# Patient Record
Sex: Male | Born: 1965
Health system: Southern US, Community
[De-identification: ages and names within clinical notes are randomized; demographics above are authoritative.]

## PROBLEM LIST (undated history)

## (undated) DIAGNOSIS — T7840XA Allergy, unspecified, initial encounter: Secondary | ICD-10-CM

## (undated) DIAGNOSIS — G4733 Obstructive sleep apnea (adult) (pediatric): Secondary | ICD-10-CM

## (undated) DIAGNOSIS — I10 Essential (primary) hypertension: Secondary | ICD-10-CM

## (undated) DIAGNOSIS — Z5189 Encounter for other specified aftercare: Secondary | ICD-10-CM

## (undated) DIAGNOSIS — R002 Palpitations: Secondary | ICD-10-CM

## (undated) HISTORY — DX: Obstructive sleep apnea (adult) (pediatric): G47.33

## (undated) HISTORY — DX: Allergy, unspecified, initial encounter: T78.40XA

## (undated) HISTORY — DX: Encounter for other specified aftercare: Z51.89

## (undated) HISTORY — DX: Palpitations: R00.2

## (undated) HISTORY — PX: OTHER SURGICAL HISTORY: SHX169

## (undated) HISTORY — DX: Essential (primary) hypertension: I10

---

## 2003-01-28 ENCOUNTER — Encounter: Admission: RE | Admit: 2003-01-28 | Discharge: 2003-01-28 | Payer: Self-pay | Admitting: Family Medicine

## 2003-01-28 ENCOUNTER — Ambulatory Visit (HOSPITAL_COMMUNITY): Admission: RE | Admit: 2003-01-28 | Discharge: 2003-01-28 | Payer: Self-pay | Admitting: Family Medicine

## 2003-02-18 ENCOUNTER — Encounter: Admission: RE | Admit: 2003-02-18 | Discharge: 2003-02-18 | Payer: Self-pay | Admitting: Family Medicine

## 2003-03-11 ENCOUNTER — Encounter: Admission: RE | Admit: 2003-03-11 | Discharge: 2003-03-11 | Payer: Self-pay | Admitting: Sports Medicine

## 2004-12-19 ENCOUNTER — Ambulatory Visit: Payer: Self-pay | Admitting: Family Medicine

## 2005-05-01 ENCOUNTER — Ambulatory Visit: Payer: Self-pay | Admitting: Family Medicine

## 2006-05-14 ENCOUNTER — Ambulatory Visit: Payer: Self-pay | Admitting: Sports Medicine

## 2006-06-04 ENCOUNTER — Ambulatory Visit: Payer: Self-pay | Admitting: Family Medicine

## 2006-10-10 ENCOUNTER — Ambulatory Visit: Payer: Self-pay | Admitting: Family Medicine

## 2007-01-23 DIAGNOSIS — I10 Essential (primary) hypertension: Secondary | ICD-10-CM | POA: Insufficient documentation

## 2007-05-26 ENCOUNTER — Telehealth: Payer: Self-pay | Admitting: *Deleted

## 2007-05-27 ENCOUNTER — Ambulatory Visit: Payer: Self-pay | Admitting: Sports Medicine

## 2007-06-19 ENCOUNTER — Ambulatory Visit: Payer: Self-pay | Admitting: Family Medicine

## 2008-01-22 ENCOUNTER — Ambulatory Visit: Payer: Self-pay | Admitting: Family Medicine

## 2008-03-17 ENCOUNTER — Ambulatory Visit: Payer: Self-pay | Admitting: Family Medicine

## 2008-03-17 LAB — CONVERTED CEMR LAB
ALT: 18 units/L (ref 0–53)
AST: 18 units/L (ref 0–37)
BUN: 13 mg/dL (ref 6–23)
CO2: 24 meq/L (ref 19–32)
Chloride: 104 meq/L (ref 96–112)
Creatinine, Ser: 1.05 mg/dL (ref 0.40–1.50)
LDL Cholesterol: 104 mg/dL — ABNORMAL HIGH (ref 0–99)
Potassium: 4.5 meq/L (ref 3.5–5.3)
Total Protein: 7.8 g/dL (ref 6.0–8.3)
Triglycerides: 103 mg/dL (ref <150)
VLDL: 21 mg/dL (ref 0–40)

## 2008-03-19 ENCOUNTER — Encounter: Payer: Self-pay | Admitting: Family Medicine

## 2008-09-02 ENCOUNTER — Encounter: Payer: Self-pay | Admitting: *Deleted

## 2009-01-27 ENCOUNTER — Ambulatory Visit: Payer: Self-pay | Admitting: Family Medicine

## 2009-01-27 DIAGNOSIS — M6580 Other synovitis and tenosynovitis, unspecified site: Secondary | ICD-10-CM | POA: Insufficient documentation

## 2009-04-11 ENCOUNTER — Encounter: Payer: Self-pay | Admitting: Family Medicine

## 2009-04-11 ENCOUNTER — Ambulatory Visit: Payer: Self-pay | Admitting: Family Medicine

## 2009-04-11 DIAGNOSIS — J45909 Unspecified asthma, uncomplicated: Secondary | ICD-10-CM | POA: Insufficient documentation

## 2009-04-19 ENCOUNTER — Encounter: Admission: RE | Admit: 2009-04-19 | Discharge: 2009-04-19 | Payer: Self-pay | Admitting: Family Medicine

## 2009-04-28 ENCOUNTER — Telehealth: Payer: Self-pay | Admitting: Family Medicine

## 2009-06-08 ENCOUNTER — Telehealth: Payer: Self-pay | Admitting: *Deleted

## 2009-06-13 ENCOUNTER — Encounter: Payer: Self-pay | Admitting: Family Medicine

## 2009-06-13 ENCOUNTER — Ambulatory Visit: Payer: Self-pay | Admitting: Family Medicine

## 2009-06-13 LAB — CONVERTED CEMR LAB
Chloride: 105 meq/L (ref 96–112)
Creatinine, Ser: 1.12 mg/dL (ref 0.40–1.50)
Sodium: 142 meq/L (ref 135–145)

## 2009-12-01 ENCOUNTER — Ambulatory Visit: Payer: Self-pay | Admitting: Family Medicine

## 2009-12-01 DIAGNOSIS — E669 Obesity, unspecified: Secondary | ICD-10-CM | POA: Insufficient documentation

## 2009-12-01 DIAGNOSIS — R351 Nocturia: Secondary | ICD-10-CM | POA: Insufficient documentation

## 2009-12-01 LAB — CONVERTED CEMR LAB
Bilirubin Urine: NEGATIVE
Blood in Urine, dipstick: NEGATIVE
Ketones, urine, test strip: NEGATIVE
Specific Gravity, Urine: 1.025
Urobilinogen, UA: 0.2

## 2009-12-15 ENCOUNTER — Telehealth: Payer: Self-pay | Admitting: Family Medicine

## 2009-12-29 ENCOUNTER — Ambulatory Visit: Payer: Self-pay | Admitting: Family Medicine

## 2009-12-29 LAB — CONVERTED CEMR LAB
Glucose, Bld: 90 mg/dL (ref 70–99)
PSA: 0.29 ng/mL (ref 0.10–4.00)
Potassium: 4.5 meq/L (ref 3.5–5.3)
Sodium: 139 meq/L (ref 135–145)

## 2010-07-26 ENCOUNTER — Encounter: Payer: Self-pay | Admitting: Family Medicine

## 2010-12-26 NOTE — Progress Notes (Signed)
Summary: triage  Phone Note Call from Patient Call back at Home Phone [phone]   Caller: Patient Summary of Call: Pt's bp is up 168/117 this a.m.  and at 11a.m. 178/121  and about 1:50 157/98.  Said that he Dr. Deirdre Priest had talked about going up on his bp med. Initial call taken by: Clydell Hakim,  December 15, 2009 2:19 PM  Follow-up for Phone Call        left message that I will forward to PCP Follow-up by: Gladstone Pih,  December 15, 2009 2:40 PM  Additional Follow-up for Phone Call Additional follow up Details #1::        Ask him to take 2 enalapril every day and continue to monitor his blood pressure see me in 2 weeks.  I will call in a new rx if he needs it thanks [facility] Additional Follow-up by: Pearlean Brownie MD,  December 15, 2009 3:30 PM    Additional Follow-up for Phone Call Additional follow up Details #2::    left message to return call Follow-up by: Gladstone Pih,  December 15, 2009 4:43 PM  Additional Follow-up for Phone Call Additional follow up Details #3:: Details for Additional Follow-up Action Taken: Pt returned call. Additional Follow-up by: Clydell Hakim,  December 15, 2009 4:50 PM  New/Updated Medications: ENALAPRIL MALEATE 10 MG  TABS (ENALAPRIL MALEATE) Take 2 tab by mouth daily left message.Golden Circle RN  December 16, 2009 9:18 AM  he called back. gave him md response .thinks he has enough meds to lat until appt with pcp 2/3.Marland KitchenGolden Circle RN  December 19, 2009 2:03 PM

## 2010-12-26 NOTE — Miscellaneous (Signed)
  Clinical Lists Changes  Problems: Changed problem from ASTHMA, MILD, INTERMITTENT (ICD-493.90) to ASTHMA, PERSISTENT (ICD-493.90) 

## 2010-12-26 NOTE — Assessment & Plan Note (Signed)
Summary: cpe,df   Vital Signs:  Patient profile:   45 year old male Height:      71 inches Weight:      286.1 pounds BMI:     40.05 Temp:     97.5 degrees F oral Pulse rate:   89 / minute BP sitting:   161 / 95  (left arm) Cuff size:   large  Vitals Entered By: Garen Grams LPN (December 01, 2009 9:29 AM) CC: CPE Is Patient Diabetic? No Pain Assessment Patient in pain? no        CC:  CPE.  History of Present Illness: Current Problems:  NOCTURIA (XBJ-478.29) has had 3 nights this week that underwear had small amount of urine on them.  Has happened occaisionally over the years.  Did drink larger amts of liquid prior to one of the nights.  Seen at health dept who did what sounds like a swab that showed only inflamation.  No pain no masses no change in stream or frequency or bleeding  ASTHMA, MILD, INTERMITTENT (ICD-493.90) using albuterol regularly but no flovent.  Not interfering with activities  HYPERTENSION, BENIGN SYSTEMIC (ICD-401.1) Disease Monitoring   Blood pressure range:has machine but not checkign       Chest pain: N     Dyspnea:n Medications   Compliance: just restarted medication one day agao   Lightheadedness: N     Edema:N Prevention   Exercise: none regularly    Salt restriction:No  TENDINITIS, KNEE (ICD-727.09) improved with leg strenthening  ROS - as above PMH - Medications reviewed and updated in medication list.  Smoking Status noted in VS form     Habits & Providers  Alcohol-Tobacco-Diet     Tobacco Status: never  Current Medications (verified): 1)  Flovent Hfa 110 Mcg/act Aero (Fluticasone Propionate  Hfa) .Marland Kitchen.. 1 Puff Two Times A Day 2)  Enalapril Maleate 10 Mg  Tabs (Enalapril Maleate) .... Take 1 Tab By Mouth Daily 3)  Proair Hfa 108 (90 Base) Mcg/act  Aers (Albuterol Sulfate) .... 2 Inh Q4h As Needed Shortness of Breath Do Not Use Regularly  Allergies: 1)  Amoxicillin (Amoxicillin)  Social History: Horticulturist, commercial Working  at [employer]   Physical Exam  General:  Well-developed,well-nourished,in no acute distress; alert,appropriate and cooperative throughout examination obese Lungs:  Normal respiratory effort, chest expands symmetrically. Lungs are clear to auscultation, no crackles or wheezes. Heart:  Normal rate and regular rhythm. S1 and S2 normal without gallop, murmur, click, rub or other extra sounds. Abdomen:  Bowel sounds positive,abdomen soft and non-tender without masses, organomegaly or hernias noted. obese Rectal:  No external abnormalities noted. Normal sphincter tone. No rectal masses or tenderness. Genitalia:  Testes bilaterally descended without nodularity, tenderness or masses. No scrotal masses or lesions. No penis lesions or urethral discharge. Prostate:  Prostate gland firm and smooth, no enlargement, nodularity, tenderness, mass, asymmetry or induration.  Not completely felt due to habitus Extremities:  Quad on left less strong than on right.  No effusion of knees   Impression & Recommendations:  Problem # 1:  NOCTURIA (ICD-788.43) normal exam and urinalysis.  No red flags for infection.  Will check PSA after had discussion with next blood draw Orders: Urinalysis-[facility] (00000) [facility]- Est  Level 4 (56213)  Problem # 2:  HYPERTENSION, BENIGN SYSTEMIC (ICD-401.1) Not well controlled.  Monitor and likely increase his medications  His updated medication list for this problem includes:    Enalapril Maleate 10 Mg  Tabs (Enalapril maleate) .Marland Kitchen... Take 1 tab by mouth daily  Orders: [facility]- Est  Level 4 (99214)Future Orders: Basic Met-[facility] ([id number]) ... 11/29/2010  Problem # 3:  ASTHMA, MILD, INTERMITTENT (ICD-493.90) Assessment: Unchanged  Discussed appropriate way to take controller and rescue meds The following medications were removed from the medication list:    Prednisone 20 Mg Tabs (Prednisone) .Marland Kitchen... 2 tabs once a day for five days His updated medication list for this  problem includes:    Flovent Hfa 110 Mcg/act Aero (Fluticasone propionate  hfa) .Marland Kitchen... 1 puff two times a day    Proair Hfa 108 (90 Base) Mcg/act Aers (Albuterol sulfate) .Marland Kitchen... 2 inh q4h as needed shortness of breath do not use regularly  Orders: [facility]- Est  Level 4 (78295)  Problem # 4:  TENDINITIS, KNEE (ICD-727.09) Assessment: Improved discussed need for weight loss with arthritis history  Complete Medication List: 1)  Flovent Hfa 110 Mcg/act Aero (Fluticasone propionate  hfa) .Marland Kitchen.. 1 puff two times a day 2)  Enalapril Maleate 10 Mg Tabs (Enalapril maleate) .... Take 1 tab by mouth daily 3)  Proair Hfa 108 (90 Base) Mcg/act Aers (Albuterol sulfate) .... 2 inh q4h as needed shortness of breath do not use regularly  Other Orders: Future Orders: PSA-[facility] ([id number]) ... 11/29/2010  Patient Instructions: 1)  Please schedule a follow-up appointment in 2 months.  2)  Call in 2 weeks if your blood pressure is not < 140/90 most of the time.  We will change your medications 3)  If you develop pain when you urinate or if the losing urine gets worse come back immediately 4)  It is important that you exercise reguarly at least 20 minutes 5 times a week. If you develop chest pain, have severe difficulty breathing, or feel very tired, stop exercising immediately and seek medical attention.  5)  You need to lose weight.  Prescriptions: PROAIR HFA 108 (90 BASE) MCG/ACT  AERS (ALBUTEROL SULFATE) 2 inh q4h as needed shortness of breath do not use regularly  #1 x 3   Entered and Authorized by:   Pearlean Brownie MD   Signed by:   Pearlean Brownie MD on 12/01/2009   Method used:   [facility] to        [facility]  [address] [phone][facility] (retail)       [address]       [place], [place]  [id number]       Ph: [phone] or [phone]       Fax: [phone]   RxID:   [id number] FLOVENT HFA 110 MCG/ACT AERO (FLUTICASONE PROPIONATE  HFA) 1 puff two times a day  #1  x 11   Entered and Authorized by:   Pearlean Brownie MD   Signed by:   Pearlean Brownie MD on 12/01/2009   Method used:   [facility] to        [facility]  [address] [phone]* (retail)       [address]       [place], [place]  [id number]       Ph: [phone] or [phone]       Fax: [phone]   RxID:   [id number] ENALAPRIL MALEATE 10 MG  TABS (ENALAPRIL MALEATE) Take 1 tab by mouth daily  #30 x 1   Entered and Authorized by:   Pearlean Brownie MD   Signed by:   Pearlean Brownie  MD on 12/01/2009   Method used:   [facility]        [facility]  [address] [phone]* (retail)       [address]       [place], [place]  [address]       Ph: [phone] or [phone]       Fax: [phone]   RxID:   [id number]   Laboratory Results   Urine Tests  Date/Time Received: December 01, 2009 9:34 AM  Date/Time Reported: December 01, 2009 9:42 AM   Routine Urinalysis   Color: yellow Appearance: Clear Glucose: negative   (Normal Range: Negative) Bilirubin: negative   (Normal Range: Negative) Ketone: negative   (Normal Range: Negative) Spec. Gravity: 1.025   (Normal Range: 1.003-1.035) Blood: negative   (Normal Range: Negative) pH: 5.5   (Normal Range: 5.0-8.0) Protein: negative   (Normal Range: Negative) Urobilinogen: 0.2   (Normal Range: 0-1) Nitrite: negative   (Normal Range: Negative) Leukocyte Esterace: negative   (Normal Range: Negative)    Comments: ...........test performed by...........Marland KitchenTerese Door, CMA       Prevention & Chronic Care Immunizations   Influenza vaccine: Historical  (09/01/2008)   Influenza vaccine due: 09/01/2009    Tetanus booster: Not documented    Pneumococcal vaccine: Not documented  Other Screening   PSA: Not documented   PSA ordered.   Smoking status: never  (12/01/2009)  Lipids   Total Cholesterol: 162  (03/17/2008)   LDL: 104  (03/17/2008)   LDL  Direct: Not documented   HDL: 37  (03/17/2008)   Triglycerides: 103  (03/17/2008)  Hypertension   Last Blood Pressure: 161 / 95  (12/01/2009)   Serum creatinine: 1.12  (06/13/2009)   Serum potassium 4.3  (06/13/2009)    Hypertension flowsheet reviewed?: Yes   Progress toward BP goal: Deteriorated  Self-Management Support :    Patient will work on the following items until the next clinic visit to reach self-care goals:     Medications and monitoring: take my medicines every day, check my blood pressure, weigh myself weekly  (12/01/2009)     Eating: eat more vegetables, eat foods that are low in salt  (12/01/2009)     Other: Try a pill box   Consider daily exercise  (12/01/2009)    Hypertension self-management support: Written self-care plan  (12/01/2009)   Hypertension self-care plan printed.

## 2010-12-26 NOTE — Assessment & Plan Note (Signed)
Summary: HTN/[place]   Vital Signs:  Patient profile:   45 year old male Height:      71 inches Weight:      288 pounds BMI:     40.31 Temp:     97.8 degrees F oral Pulse rate:   95 / minute BP sitting:   140 / 93  (left arm) Cuff size:   large  Vitals Entered By: Garen Grams LPN (December 29, 2009 9:37 AM) CC: f/u HTN Is Patient Diabetic? No Pain Assessment Patient in pain? no        CC:  f/u HTN.  History of Present Illness: HYPERTENSION Disease Monitoring   Blood pressure range: forgot readings      Chest pain: N     Dyspnea:N Medications   Compliance: daily enalapril   Lightheadedness: N     Edema:N Prevention   Exercise: 3 x week    Salt restriction:does not add   ROS - as above PMH - Medications reviewed and updated in medication list.  Smoking Status noted in VS form      Habits & Providers  Alcohol-Tobacco-Diet     Tobacco Status: never  Current Medications (verified): 1)  Flovent Hfa 110 Mcg/act Aero (Fluticasone Propionate  Hfa) .Marland Kitchen.. 1 Puff Two Times A Day 2)  Enalapril Maleate 20 Mg  Tabs (Enalapril Maleate) .... Take 1 Tab By Mouth Daily For Hypertension 3)  Proair Hfa 108 (90 Base) Mcg/act  Aers (Albuterol Sulfate) .... 2 Inh Q4h As Needed Shortness of Breath Do Not Use Regularly  Allergies: 1)  Amoxicillin (Amoxicillin)  Physical Exam  General:  Well-developed,well-nourished,in no acute distress; alert,appropriate and cooperative throughout examination   Impression & Recommendations:  Problem # 1:  HYPERTENSION, BENIGN SYSTEMIC (ICD-401.1) Still not controlled.  encourage weight change and monitor.  If persist would add hctz  His updated medication list for this problem includes:    Enalapril Maleate 20 Mg Tabs (Enalapril maleate) .Marland Kitchen... Take 1 tab by mouth daily for hypertension  Orders: Basic Met-[facility] ([id number]) [facility]- Est Level  3 (09811)  Problem # 2:  Preventive Health Care (ICD-V70.0) discussed psa he would like to be  checked  Complete Medication List: 1)  Flovent Hfa 110 Mcg/act Aero (Fluticasone propionate  hfa) .Marland Kitchen.. 1 puff two times a day 2)  Enalapril Maleate 20 Mg Tabs (Enalapril maleate) .... Take 1 tab by mouth daily for hypertension 3)  Proair Hfa 108 (90 Base) Mcg/act Aers (Albuterol sulfate) .... 2 inh q4h as needed shortness of breath do not use regularly  Other Orders: PSA-[facility] ([id number])  Patient Instructions: 1)  Please schedule a follow-up appointment in 6 months .  2)  Call if your blood pressure is regularly > 140/90 3)  Keep up the exercise that is great 4)  Aim to lose 2-4 lb a week - weigh yourself once a week 5)  Portion size control will help  6)  Snack on low calorie foods or cold water Prescriptions: ENALAPRIL MALEATE 20 MG  TABS (ENALAPRIL MALEATE) Take 1 tab by mouth daily for hypertension  #90 x 3   Entered and Authorized by:   Pearlean Brownie MD   Signed by:   Pearlean Brownie MD on 12/29/2009   Method used:   [facility] to        [facility]  [address] [phone]* (retail)       [address]       [place], [place]  [id number]       Ph: [phone] or [phone]       Fax: [phone]   RxID:   [id number]    Prevention & Chronic Care Immunizations   Influenza vaccine: Historical  (09/01/2008)   Influenza vaccine due: 09/01/2009    Tetanus booster: Not documented    Pneumococcal vaccine: Not documented  Other Screening   PSA: Not documented   PSA ordered.   Smoking status: never  (12/29/2009)  Lipids   Total Cholesterol: 162  (03/17/2008)   LDL: 104  (03/17/2008)   LDL Direct: Not documented   HDL: 37  (03/17/2008)   Triglycerides: 103  (03/17/2008)  Hypertension   Last Blood Pressure: 140 / 93  (12/29/2009)   Serum creatinine: 1.12  (06/13/2009)   Serum potassium 4.3  (06/13/2009)    Hypertension flowsheet reviewed?: Yes   Progress toward BP goal: Improved  Self-Management Support :   Personal Goals  (by the next clinic visit) :      Personal blood pressure goal: 140/90  (12/29/2009)   Patient will work on the following items until the next clinic visit to reach self-care goals:     Medications and monitoring: check my blood pressure  (12/29/2009)     Eating: drink diet soda or water instead of juice or soda, use fresh or frozen vegetables  (12/29/2009)     Other: Try a pill box   Consider daily exercise  (12/01/2009)    Hypertension self-management support: Written self-care plan  (12/29/2009)   Hypertension self-care plan printed.

## 2010-12-28 ENCOUNTER — Encounter: Payer: Self-pay | Admitting: *Deleted

## 2011-01-10 ENCOUNTER — Other Ambulatory Visit: Payer: Self-pay | Admitting: Family Medicine

## 2011-01-10 NOTE — Telephone Encounter (Signed)
Please review and refill

## 2011-06-27 ENCOUNTER — Encounter: Payer: Self-pay | Admitting: Family Medicine

## 2011-06-27 ENCOUNTER — Ambulatory Visit (INDEPENDENT_AMBULATORY_CARE_PROVIDER_SITE_OTHER): Payer: BC Managed Care – PPO | Admitting: Family Medicine

## 2011-06-27 DIAGNOSIS — I10 Essential (primary) hypertension: Secondary | ICD-10-CM

## 2011-06-27 DIAGNOSIS — J45909 Unspecified asthma, uncomplicated: Secondary | ICD-10-CM

## 2011-06-27 DIAGNOSIS — Z125 Encounter for screening for malignant neoplasm of prostate: Secondary | ICD-10-CM

## 2011-06-27 DIAGNOSIS — Z23 Encounter for immunization: Secondary | ICD-10-CM

## 2011-06-27 LAB — COMPREHENSIVE METABOLIC PANEL
ALT: 18 U/L (ref 0–53)
AST: 19 U/L (ref 0–37)
Albumin: 4.3 g/dL (ref 3.5–5.2)
Alkaline Phosphatase: 76 U/L (ref 39–117)
BUN: 12 mg/dL (ref 6–23)
CO2: 27 mEq/L (ref 19–32)
Calcium: 9.8 mg/dL (ref 8.4–10.5)
Chloride: 104 mEq/L (ref 96–112)
Creat: 1.05 mg/dL (ref 0.50–1.35)
Glucose, Bld: 88 mg/dL (ref 70–99)
Potassium: 4.4 mEq/L (ref 3.5–5.3)
Sodium: 142 mEq/L (ref 135–145)
Total Bilirubin: 0.7 mg/dL (ref 0.3–1.2)
Total Protein: 7.8 g/dL (ref 6.0–8.3)

## 2011-06-27 LAB — PSA: PSA: 0.27 ng/mL (ref <=4.00)

## 2011-06-27 MED ORDER — ENALAPRIL MALEATE 20 MG PO TABS: 20.0000 mg | ORAL_TABLET | Freq: Every day | ORAL | Status: DC

## 2011-06-27 MED ORDER — TRIAMCINOLONE ACETONIDE 0.1 % EX OINT: TOPICAL_OINTMENT | Freq: Two times a day (BID) | CUTANEOUS | Status: DC

## 2011-06-27 MED ORDER — FLUTICASONE PROPIONATE HFA 110 MCG/ACT IN AERO: 1.0000 | INHALATION_SPRAY | Freq: Two times a day (BID) | RESPIRATORY_TRACT | Status: DC

## 2011-06-27 MED ORDER — ALBUTEROL SULFATE HFA 108 (90 BASE) MCG/ACT IN AERS
2.0000 | INHALATION_SPRAY | RESPIRATORY_TRACT | Status: DC | PRN
Start: 2011-06-27 — End: 2013-10-19

## 2011-06-27 NOTE — Assessment & Plan Note (Signed)
Not well controlled.  Not using his preventer regularly.  Emphasized need to prevent not just treat wheezing episodes.  No signs of CHF

## 2011-06-27 NOTE — Progress Notes (Signed)
  Subjective:    Patient ID: Jeffery Nielsen, male    DOB: Jul 01, 1966, 45 y.o.   MRN: [id number]  HPI  Feels well.  No complaints  HYPERTENSION Disease Monitoring Home BP Monitoring not doing Chest pain- no     Dyspnea-  no  Medications Compliance: takes enalapril intermittently, did take the last 2 days. Lightheadedness-  no  Edema-  no   Asthma Wheezes intermittently and helps when uses albuterol but is out of this and flovent.  No lower extremitiy edema.  Has had since was a child.  No smoking  ROS - See HPI  PMH Cardiovascular risk factors: hypertension Lab Review   Potassium  Date Value Range Status  12/29/2009 4.5  3.5-5.3 (meq/L) Final     Sodium  Date Value Range Status  12/29/2009 139  135-145 (meq/L) Final    Review of Systems Patient reports no  vision/ hearing changes,anorexia, weight change, fever ,adenopathy, persistant / recurrent hoarseness, swallowing issues, chest pain, edema,persistant / recurrent cough, hemoptysis,gastrointestinal  bleeding (melena, rectal bleeding), abdominal pain, excessive heart burn, GU symptoms(dysuria, hematuria, pyuria, voiding/incontinence  Issues) syncope, focal weakness, severe memory loss, concerning skin lesions, depression, anxiety, abnormal bruising/bleeding, major joint swelling.       Objective:   Physical Exam  Heart - Regular rate and rhythm.  No murmurs, gallops or rubs.    Lungs:  Normal respiratory effort, chest expands symmetrically. Lungs are clear to auscultation, no crackles or wheezes. Abdomen: Obese soft and non-tender without masses, organomegaly or hernias noted.  No guarding or rebound Extremities:  No cyanosis, edema, or deformity noted with good range of motion of all major joints.   Neck:  No deformities, thyromegaly, masses, or tenderness noted.   Supple with full range of motion without pain.       Assessment & Plan:

## 2011-06-27 NOTE — Patient Instructions (Signed)
Check your blood pressure is usually > 140/90 then call   Your most important health issue is your weight  Set up a meeting with our health coach and me in 6 weeks    Bring in all your meds  and bring in your blood pressure readings   I will call you if your tests are not good.  Otherwise I will send you a letter.  If you do not hear from me with in 2 weeks please call our office.

## 2011-06-27 NOTE — Assessment & Plan Note (Signed)
Lab Results  Component Value Date   NA 139 12/29/2009   K 4.5 12/29/2009   CL 105 12/29/2009   CO2 22 12/29/2009   BUN 12 12/29/2009   CREATININE 0.91 12/29/2009    BP Readings from Last 3 Encounters:  06/27/11 148/90  12/29/09 140/93  12/01/09 161/95    Assessment: Hypertension control:  moderately elevated  Progress toward goals:  unchanged Barriers to meeting goals:  nonadherence to medications  Plan: Hypertension treatment:  continue current medications.   If BP remains high despite taking enalapril regularly and weight loss will need to add diuretic

## 2011-06-28 ENCOUNTER — Encounter: Payer: Self-pay | Admitting: Family Medicine

## 2011-07-24 NOTE — Progress Notes (Signed)
Schedule co visit for 9/19 while you are in clinic

## 2011-08-15 ENCOUNTER — Encounter: Payer: Self-pay | Admitting: Home Health Services

## 2011-08-15 ENCOUNTER — Ambulatory Visit (INDEPENDENT_AMBULATORY_CARE_PROVIDER_SITE_OTHER): Payer: BC Managed Care – PPO | Admitting: Family Medicine

## 2011-08-15 VITALS — BP 150/102 | HR 85 | Temp 98.1°F | Ht 71.0 in | Wt 295.1 lb

## 2011-08-15 DIAGNOSIS — I10 Essential (primary) hypertension: Secondary | ICD-10-CM

## 2011-08-15 DIAGNOSIS — E669 Obesity, unspecified: Secondary | ICD-10-CM

## 2011-08-15 DIAGNOSIS — Z23 Encounter for immunization: Secondary | ICD-10-CM

## 2011-08-15 NOTE — Patient Instructions (Addendum)
1. Continue to cardio/stength exercises 3-4 times a week. 2. Continue to take enalapril EVERY DAY! 3. Take blood pressure 2-3 times a week and keep record. 4. Call Rosalita Chessman on Tuesday 9/25 for follow up. [phone]. 5. Take Flovent 2 times a day to prevent asthma attacks.  6. Drink 8 glasses of water a day.

## 2011-08-15 NOTE — Assessment & Plan Note (Signed)
Assessment: Condition worsening.   Plan: Continue counseling with health educator for weight loss/exersice.  Pt set goal to exercise 3-4 times a week for and hour.  Doing both cardio/strengthening exercises. Start to drink just water versus soda/drink mixes.

## 2011-08-15 NOTE — Progress Notes (Signed)
  Subjective:    Patient ID: Jeffery Nielsen, male    DOB: 02/09/1966, 45 y.o.   MRN: [id number]  HPI  HYPERTENSION Disease Monitoring  Blood pressure range-Pt could not recall bp values from home.  Chest pain- no  Dyspnea- no Medications Compliance- Pt reports taking medicine most days but misses a few. Took this AM but not yesterday Lightheadedness- no Edema- no    OBESITY  Current weight/BMI :  41.16 How long have been obese:  10 + years Course:  worsening Problems or symptoms it causes:  hypertension   Things have tried to improve:  Increase exercise     Patient Identified Concern: weight, blood pressure Stage of Change Patient Is In: Preparation.  Pt has been making behavioral changes for less than 2 months.  Patient Reported Barriers: lazy, time for just exercising. Patient Reported Perceived Benefits: Increased energy, stamina, control blood pressure, possibility of reducing medications. Patient Reports Self-Efficacy:  Pt displays moderate self efficacy for increasing exercise. Behavior Change Supports:  Goals:  To reduce weight by increasing exercising to 3-4 times a week.  To take bp medications daily and record values 2-3 times a week.  Weekly follow up phone calls with health coach.  Patient Education: We discussed importance of both cardio and strengthening exercises.  Importance of exercise, stress reduction, and medication compliance to control blood pressure.     Review of Systems  See above     Objective:   Physical Exam  No acute distress.      Assessment & Plan:

## 2011-08-15 NOTE — Assessment & Plan Note (Signed)
Assessment: Hypotension control: elevated Progress towards goals: unchaged Barriers to meet goals: inconsistent adherence to medications. Weight gain.   Plan: Hypertension treatment:  Continue current medications and take blood pressure 2-3 times a week.  Weekly phone calls with health coach to report values and progress.  If blood pressure remains high after 3 weeks, possibly increasing enalapril to 2x a week.   Continue counseling with health educator for medication compliance and weight loss.

## 2011-08-24 ENCOUNTER — Telehealth: Payer: Self-pay | Admitting: Home Health Services

## 2011-08-24 NOTE — Telephone Encounter (Signed)
Spoke with [employer].  Pt seems very positive.  Pt reports walking 3 times a week, 2 miles at a time.  He also reports that he will be walking a 5k this Saturday.  Pt reports taking bp medications and flovent daily and has not missed day.  Pt has been taking bp measurements daily and values average around 150/92.  Goals for next week include daily medication compliance, continue walking 3x week, and start to watch his portion size to gain an awareness of how much/little he is eating.  Pt will call back next Thursday 10/4 afternoon.  Pt's overall goal is to control bp and lose weight.

## 2011-09-13 ENCOUNTER — Telehealth: Payer: Self-pay | Admitting: Home Health Services

## 2011-09-13 NOTE — Telephone Encounter (Signed)
Spoke with [employer].  Pt reports taking HTN medication daily, not missing any days.  His average daily bp value is 157/98.  Pt expressed concern over this number.  Pt has been walking/running 5k's almost every weekend and recently reduced 7 minutes off his time to 40 minutes.  Pt also takes Flovent daily and has not had any problems with asthma.  Pt reports having lost 12 lbs over past few weeks.    Pt is highly motivated and shared that he is ready to be healthier.  Pt's overall goal is to lose weight, control HTN and asthma.

## 2011-09-14 MED ORDER — ENALAPRIL MALEATE 20 MG PO TABS: 20.0000 mg | ORAL_TABLET | Freq: Two times a day (BID) | ORAL | Status: DC

## 2011-09-14 NOTE — Telephone Encounter (Signed)
Addended by: Skyy Mcknight, LEE L on: 09/14/2011 10:51 AM   Modules accepted: Orders

## 2011-09-14 NOTE — Telephone Encounter (Signed)
Called him. Decided to increase enalapril to 20 mg twice daily and to see him for an office visit in 2 weeks for blood test

## 2011-09-21 ENCOUNTER — Telehealth: Payer: Self-pay | Admitting: Home Health Services

## 2011-09-21 NOTE — Telephone Encounter (Signed)
Spoke with [employer].  Pt reports taking medications daily and has increased his enalapril to twice a day.  Pt has not been monitoring bp and is looking for a place to start taking it.  I encouraged him to start taking it a few times a week with the medication change for monitoring.  We discussed low sodium diet and to try to stay below 1500 mg.  Pt said he would start to read food labels and become more aware of how much sodium he is in taking.  Pt has been working out at [employer] [place] but that has ended and he has made a plan to find another resource for weekly exercise.   Pt's overall goal is to lose weight and manage bp.

## 2011-11-09 ENCOUNTER — Telehealth: Payer: Self-pay | Admitting: Home Health Services

## 2011-11-09 NOTE — Telephone Encounter (Signed)
Spoke with [employer].  Pt reports taking medications as prescribed.  Enalapril 2x a day.  Does not miss days.  Pt has not been taking blood pressure regularly but had it taken 1x last week and thinks he might need to go up on bp medications.  Scheduled office visit with PCP for 12/03/2011.  Pt reports not working out as much recently but did a 5k on 10/31/11 (time 45 min).  Pt is going to be doing another 5k next week and set goal to start working out on Sunday again.  Pt reported losing 15 lbs over past few months but recently has gained 6 lbs back.   Pt wants to continue with weight loss/exercise.  Pt's overall goal is weight loss/bp management.

## 2011-11-16 ENCOUNTER — Telehealth: Payer: Self-pay | Admitting: Home Health Services

## 2011-11-16 NOTE — Telephone Encounter (Signed)
Spoke with [employer].  Pt reports taking medications as prescribed 2x a day without missing a day.  Pt took bp on Monday 11/12/11; 172/102.   Reminded pt of follow up appointment with PCP on 12/03/11.  Pt participated in 5k last weekend but walking has been inconsistent since.  Pt's strategies for holiday season is to maintain weight.  Filling up on vegetables before less healthy choices.   Pt's overall goal is weight loss/bp management.

## 2011-11-29 ENCOUNTER — Other Ambulatory Visit: Payer: Self-pay | Admitting: Family Medicine

## 2011-12-03 ENCOUNTER — Ambulatory Visit (INDEPENDENT_AMBULATORY_CARE_PROVIDER_SITE_OTHER): Payer: BC Managed Care – PPO | Admitting: Family Medicine

## 2011-12-03 ENCOUNTER — Encounter: Payer: Self-pay | Admitting: Family Medicine

## 2011-12-03 VITALS — BP 150/98 | HR 98 | Temp 97.7°F | Ht 71.0 in | Wt 284.0 lb

## 2011-12-03 DIAGNOSIS — I10 Essential (primary) hypertension: Secondary | ICD-10-CM

## 2011-12-03 DIAGNOSIS — E669 Obesity, unspecified: Secondary | ICD-10-CM

## 2011-12-03 DIAGNOSIS — J45909 Unspecified asthma, uncomplicated: Secondary | ICD-10-CM

## 2011-12-03 DIAGNOSIS — Z202 Contact with and (suspected) exposure to infections with a predominantly sexual mode of transmission: Secondary | ICD-10-CM

## 2011-12-03 MED ORDER — HYDROCHLOROTHIAZIDE 12.5 MG PO CAPS: 12.5000 mg | ORAL_CAPSULE | Freq: Every day | ORAL | Status: DC

## 2011-12-03 NOTE — Assessment & Plan Note (Signed)
Not at goal.   Will add low dose hctz and strongly encourage exercise and weight loss.  His goal is to get off medications Will check bmet after on hctz for two weeks

## 2011-12-03 NOTE — Progress Notes (Signed)
  Subjective:    Patient ID: Jeffery Nielsen, male    DOB: Jan 31, 1966, 46 y.o.   MRN: [id number]  HPI Feels well.  No complaints  HYPERTENSION Disease Monitoring Home BP Monitoring 150s/90s Chest pain- no     Dyspnea-  no  Medications Compliance: taking enalapril regularly twice daily  Lightheadedness-  no  Edema-  no   Asthma  Has not used albuterol for months.  Seems to have gotten better since he has been running 5ks. No nocturnal or exercise symptoms.  No recent exacerbations with colds   OBESITY Current weight/BMI : Body mass index is 39.61 kg/(m^2).    How long have been obese:  10+ years Course:  Somewhat improving Problems or symptoms it causes:  HTn   Things have tried to improve:  Exercise  Patient Identified Concern:  Weight loss, controlling HTN Stage of Change Patient Is In:  preparation making changes for less than 6 months.  Patient Reported Barriers:  Lazy, not natural habits of exercise/diet Patient Reported Perceived Benefits:  Look good, better health Patient Reports Self-Efficacy:   Pt displays some self-efficacy for exercising. Behavior Change Supports:  Exercises with co-workers Goals:  To start training for 5k, start low sodium diet Patient Education:  We discussed importance of monitoring salt in diet, eating 3 meals a day and creating habit of weekly exercising. Pt reports not drinking soda's as much and focuses on drinking just water.      Review of Systems     Objective:   Physical Exam  Lungs:  Normal respiratory effort, chest expands symmetrically. Lungs are clear to auscultation, no crackles or wheezes. Heart - Regular rate and rhythm.  No murmurs, gallops or rubs.    Extremities:  No cyanosis, edema, or deformity noted with good range of motion of all major joints.         Assessment & Plan:

## 2011-12-03 NOTE — Assessment & Plan Note (Signed)
Well controlled with rare albuterol use despite recent URIs.  He feels his running is making it better.

## 2011-12-03 NOTE — Progress Notes (Deleted)
  Subjective:    Patient ID: Jeffery Nielsen, male    DOB: 1966-02-14, 46 y.o.   MRN: [id number]  HPI HYPERTENSION Disease Monitoring Home BP Monitoring {home bp readings:17448} Chest pain- {YES/NO/WILD CARDS:18581}     Dyspnea-  {YES/NO/WILD WUJWJ:19147}  Medications Compliance: {med compliance:10573}. Lightheadedness-  {YES/NO/WILD WGNFA:21308}  Edema-  {YES/NO/WILD CARDS:18581}   ROS - See HPI  PMH Lab Review   Potassium  Date Value Range Status  06/27/2011 4.4  3.5-5.3 (mEq/L) Final     Sodium  Date Value Range Status  06/27/2011 142  135-145 (mEq/L) Final        OBESITY Current weight/BMI : Body mass index is 39.61 kg/(m^2).    How long have been obese:  10+ years Course:  Somewhat improving Problems or symptoms it causes:  HTn   Things have tried to improve:  Exercise  Patient Identified Concern:  Weight loss, controlling HTN Stage of Change Patient Is In:  preparation making changes for less than 6 months.  Patient Reported Barriers:  Lazy, not natural habits of exercise/diet Patient Reported Perceived Benefits:  Look good, better health Patient Reports Self-Efficacy:   Pt displays some self-efficacy for exercising. Behavior Change Supports:  Exercises with co-workers Goals:  To start training for 5k, start low sodium diet Patient Education:  We discussed importance of monitoring salt in diet, eating 3 meals a day and creating habit of weekly exercising. Pt reports not drinking soda's as much and focuses on drinking just water.      Review of Systems     Objective:   Physical Exam        Assessment & Plan:

## 2011-12-03 NOTE — Assessment & Plan Note (Signed)
Assessment:  Some what improved 11 lb wt loss since last visit.  Plan:  Continued counseling with health educator.  Start training for 5k's again.  Goal is a 6k run in February. Start monitoring salt in diet. Start eating 3 meals a day including breakfast.

## 2011-12-03 NOTE — Patient Instructions (Addendum)
Continue enalapril 20 mg twice daily Take HCTZ 12.5 mg each AM  Monitor your blood pressure aim is to be below < 140/90  If you have any chest pain or shortness of breath with exercise let me know  Come in for a blood test in 2 weeks - call the day before  Come back and see Rosalita Chessman in 1 month  1. Start 5k training again. Decide on daily route. 2. Start eating something for breakfast daily.

## 2011-12-17 ENCOUNTER — Other Ambulatory Visit: Payer: BC Managed Care – PPO

## 2011-12-17 DIAGNOSIS — I10 Essential (primary) hypertension: Secondary | ICD-10-CM

## 2011-12-17 NOTE — Progress Notes (Signed)
Bmp done todAy Jeffery Nielsen

## 2011-12-18 ENCOUNTER — Encounter: Payer: Self-pay | Admitting: Family Medicine

## 2011-12-18 LAB — BASIC METABOLIC PANEL
BUN: 17 mg/dL (ref 6–23)
Creat: 1.06 mg/dL (ref 0.50–1.35)

## 2012-01-07 ENCOUNTER — Ambulatory Visit: Payer: BC Managed Care – PPO | Admitting: Family Medicine

## 2012-08-15 ENCOUNTER — Other Ambulatory Visit: Payer: Self-pay | Admitting: Family Medicine

## 2012-08-25 ENCOUNTER — Encounter: Payer: Self-pay | Admitting: Family Medicine

## 2012-08-25 ENCOUNTER — Ambulatory Visit (INDEPENDENT_AMBULATORY_CARE_PROVIDER_SITE_OTHER): Payer: BC Managed Care – PPO | Admitting: Family Medicine

## 2012-08-25 VITALS — BP 153/88 | HR 79 | Temp 98.3°F | Ht 71.0 in | Wt 287.0 lb

## 2012-08-25 DIAGNOSIS — I10 Essential (primary) hypertension: Secondary | ICD-10-CM

## 2012-08-25 DIAGNOSIS — Z23 Encounter for immunization: Secondary | ICD-10-CM

## 2012-08-25 MED ORDER — HYDROCHLOROTHIAZIDE 12.5 MG PO CAPS: 12.5000 mg | ORAL_CAPSULE | Freq: Every day | ORAL | Status: DC

## 2012-08-25 MED ORDER — ENALAPRIL MALEATE 20 MG PO TABS: 20.0000 mg | ORAL_TABLET | Freq: Two times a day (BID) | ORAL | Status: DC

## 2012-08-25 NOTE — Assessment & Plan Note (Signed)
Not optimum.  Will have him monitor and bring in readings.  Would increase HCTZ if remains high.  Check labs

## 2012-08-25 NOTE — Patient Instructions (Addendum)
Check your blood pressure several times a week and write down.  If regularly > 140/90 call us.     I will call you if your tests are not good.  Otherwise I will send you a letter.  If you do not hear from me with in 2 weeks please call our office.     Bring in your   blood pressure readings   our medications   Copy of your work blood tests  Come back in 3 months

## 2012-08-25 NOTE — Progress Notes (Signed)
  Subjective:    Patient ID: [name], male    DOB: 08-Nov-1966, 46 y.o.   MRN: [id number]  HPI  Feels well.  Has mild wheeze occasionally Use proair 1-2 times per month. Able to run 5ks without problems runs several a moth Patient Information Form: Screening and ROS  AUDIT-C Score: 0 Do you feel safe in relationships? yes PHQ-2:negative  Review of Symptoms  General:  Negative for nexplained weight loss, fever Skin: Negative for new or changing mole, sore that won't heal HEENT: Negative for trouble hearing, trouble seeing, ringing in ears, mouth sores, hoarseness, change in voice, dysphagia. CV:  Negative for chest pain, dyspnea, edema, palpitations Resp: Negative for cough, , hemoptysis GI: Negative for nausea, vomiting, diarrhea, constipation, abdominal pain, melena, hematochezia. GU: Negative for dysuria, incontinence, urinary hesitance, hematuria, vaginal or penile discharge, polyuria, sexual difficulty, lumps in testicle or breasts MSK: Negative for muscle cramps or aches, joint pain or swelling Neuro: Negative for headaches, weakness, numbness, dizziness, passing out/fainting Psych: Negative for depression, anxiety, memory problems     Review of Systems     Objective:   Physical Exam Neck:  No deformities, thyromegaly, masses, or tenderness noted.   Supple with full range of motion without pain. Heart - Regular rate and rhythm.  No murmurs, gallops or rubs.    Lungs:  Normal respiratory effort, chest expands symmetrically. Lungs are clear to auscultation, no crackles or wheezes. Abdomen: Obese soft and non-tender without masses, organomegaly or hernias noted.  No guarding or rebound Extremities:  No cyanosis, edema, or deformity noted with good range of motion of all major joints.   Skin:  Intact without suspicious lesions or rashes       Assessment & Plan:   Normal Exam

## 2012-08-26 LAB — COMPREHENSIVE METABOLIC PANEL
AST: 19 U/L (ref 0–37)
Albumin: 4.3 g/dL (ref 3.5–5.2)
Alkaline Phosphatase: 67 U/L (ref 39–117)
BUN: 20 mg/dL (ref 6–23)
Potassium: 4.3 mEq/L (ref 3.5–5.3)
Sodium: 139 mEq/L (ref 135–145)
Total Protein: 7.2 g/dL (ref 6.0–8.3)

## 2012-08-27 ENCOUNTER — Encounter: Payer: Self-pay | Admitting: Family Medicine

## 2012-10-02 ENCOUNTER — Other Ambulatory Visit: Payer: Self-pay | Admitting: Family Medicine

## 2013-01-30 ENCOUNTER — Other Ambulatory Visit: Payer: Self-pay | Admitting: Family Medicine

## 2013-02-27 ENCOUNTER — Other Ambulatory Visit: Payer: Self-pay | Admitting: Family Medicine

## 2013-04-28 ENCOUNTER — Ambulatory Visit (HOSPITAL_COMMUNITY)
Admission: RE | Admit: 2013-04-28 | Discharge: 2013-04-28 | Disposition: A | Discharge status: Home / Self Care | Payer: BC Managed Care – PPO | Source: Ambulatory Visit | Attending: Family Medicine | Admitting: Family Medicine

## 2013-04-28 ENCOUNTER — Other Ambulatory Visit: Payer: Self-pay | Admitting: Family Medicine

## 2013-04-28 ENCOUNTER — Ambulatory Visit (INDEPENDENT_AMBULATORY_CARE_PROVIDER_SITE_OTHER): Payer: BC Managed Care – PPO | Admitting: Family Medicine

## 2013-04-28 VITALS — BP 125/91 | HR 98 | Temp 98.1°F | Ht 71.0 in | Wt 280.0 lb

## 2013-04-28 DIAGNOSIS — R002 Palpitations: Secondary | ICD-10-CM

## 2013-04-28 DIAGNOSIS — I480 Paroxysmal atrial fibrillation: Secondary | ICD-10-CM

## 2013-04-28 HISTORY — DX: Paroxysmal atrial fibrillation: I48.0

## 2013-04-28 MED ORDER — PROPRANOLOL HCL 10 MG PO TABS: 10.0000 mg | ORAL_TABLET | Freq: Three times a day (TID) | ORAL | Status: DC | PRN

## 2013-04-28 NOTE — Assessment & Plan Note (Signed)
ECG normal today, suspect this may be SVT given symptoms and EMS strip.  Orthostatic VS negative for orthostasis. Will Rx propranolol PRN for palpitations, check BMET.  F/U with Dr. Deirdre Priest in 1 month, if symptoms persist can consider further evaluation.

## 2013-04-28 NOTE — Progress Notes (Signed)
  Subjective:    Patient ID: Jeffery Nielsen, male    DOB: 09-03-1966, 47 y.o.   MRN: [id number]  HPI  Jeffery Nielsen comes in for lightheadedness that happened yesterday.  He says he had ran/walked about 3.5 miles, then 30 minutes later was sitting down and had lightheadedness and palpitations.  He called EMS, who did an EKG and took his blood pressure.  He has brought the EKG strip and it shows sinus tachycardia, pulse of 120, but no irregular rhythm.  He denies any associated chest pain, dyspnea, nausea, diaphoresis.  He has never had this happen before.  He has been training for a 5 K and admits he may not be drinking enough water.   Past Medical History  Diagnosis Date  . Hypertension   . Asthma    History  Substance Use Topics  . Smoking status: Never Smoker   . Smokeless tobacco: Never Used  . Alcohol Use: 0.5 oz/week    1 drink(s) per week   Family History  Problem Relation Age of Onset  . Coronary artery disease Father     died in 32s  . Heart disease Father   . Asthma Mother   . Heart disease Mother   - both parents died of heart attacks per patient.    Review of Systems Pertinent items in HPI    Objective:   Physical Exam BP 125/91  Pulse 98  Temp(Src) 98.1 F (36.7 C) (Oral)  Ht 5\' 11"  (1.803 m)  Wt 280 lb (127.007 kg)  BMI 39.07 kg/m2 Orthostatic VS: Lying 126/84, 92 Sitting 126/84, 94 Standing 125/91, 98 General appearance: alert, cooperative and no distress Lungs: clear to auscultation bilaterally Heart: regular rate and rhythm, S1, S2 normal, no murmur, click, rub or gallop Extremities: extremities normal, atraumatic, no cyanosis or edema Pulses: 2+ and symmetric  ED ECG REPORT   Date: 04/28/2013  EKG Time: 4:03 PM  Rate: 81  Rhythm: normal sinus rhythm,  normal EKG, normal sinus rhythm, there are no previous tracings available for comparison  Axis: Normal  Intervals:normal  ST&T Change: No abnormalities  Narrative Interpretation: normal ECG      Assessment & Plan:

## 2013-04-28 NOTE — Patient Instructions (Signed)
Your EKG is normal today.  I am checking some blood work to make sure all your electrolytes are normal.  Please be sure to drink plenty of water before you exercise.   I have sent in a prescription to your pharmacy for propranolol- if you feel light headed or have palpitations again, you can take one to help it go away.   Please make an appointment to see Dr. Deirdre Priest in about 1 month to check on how you are feeling.

## 2013-04-29 LAB — CBC
MCH: 30.3 pg (ref 26.0–34.0)
Platelets: 268 10*3/uL (ref 150–400)
RBC: 4.98 MIL/uL (ref 4.22–5.81)
RDW: 14.2 % (ref 11.5–15.5)

## 2013-04-29 LAB — BASIC METABOLIC PANEL
BUN: 15 mg/dL (ref 6–23)
Chloride: 103 mEq/L (ref 96–112)
Creat: 1.09 mg/dL (ref 0.50–1.35)

## 2013-05-01 ENCOUNTER — Ambulatory Visit (INDEPENDENT_AMBULATORY_CARE_PROVIDER_SITE_OTHER): Payer: BC Managed Care – PPO | Admitting: Family Medicine

## 2013-05-01 ENCOUNTER — Telehealth: Payer: Self-pay | Admitting: *Deleted

## 2013-05-01 VITALS — BP 129/86 | HR 80 | Temp 98.1°F | Wt 285.0 lb

## 2013-05-01 DIAGNOSIS — R002 Palpitations: Secondary | ICD-10-CM

## 2013-05-01 LAB — TSH: TSH: 0.532 u[IU]/mL (ref 0.350–4.500)

## 2013-05-01 NOTE — Assessment & Plan Note (Signed)
D/Dx: dehydration, anxiety-related, cardiac -Encouraged hydration  -Given information about panic attacks -Exercise stress test  -Follow-up in 1-2 weeks; if doing okay, may follow-up in 1 month

## 2013-05-01 NOTE — Telephone Encounter (Signed)
Pt reports continued light headed feeling and palpitations after resuming exercise routinue - request appointment to be seen and is very concerned. Denies nausea/vomiting/diarrhea or radiating pain. Wyatt Haste, RN-BSN

## 2013-05-01 NOTE — Progress Notes (Signed)
  Subjective:    Patient ID: Jeffery Nielsen, male    DOB: 1965/11/30, 47 y.o.   MRN: [id number]  HPI # Follow-up dizziness/lightheadedness and palpitations Seen 06/03 and based on negative preliminary work-up started on propranolol tid prn. He took about 4-5 doses so far. He thinks it provides some relief because before he felt really bad and felt like he was preventing himself from passing out.   He endorses symptoms currently.  Lightheadedness: pressure in his head; comes and goes   Worse when sitting down   Palpitations: heart racing   He has been training for the past 1-2 weeks for a 5K.   He is drinking 16 oz.   He had significant symptoms when he was trying to run again yesterday associated with mild left sided pain momentary; none currently and since then. But he also had symptoms most recently today when sitting and took propranolol which helped. He has new responsibilties/stressors from work.    Review of Systems Per HPI    Objective:   Physical Exam GEN: NAD; obese CV: RRR; no m/r/g PULM: NI WOB; CTAB without w/r/r ABD: soft, NT EXT: no edema  NEURO: moves all extremities well GAIT: normal  Discussed normal BMET and CBC results    Assessment & Plan:

## 2013-05-01 NOTE — Patient Instructions (Addendum)
We will refer you to a cardiologist for a stress test   Drink more water. 64 oz. If you still have symptoms, let me know, and then we can change stress test to actual referral.   Continue propranolol as needed for now  Hold off on training, vigorous exercise until you see cardiologist  Go to the emergency department if: -significant chest pain, shortness of breath, worsening palpitations      Anxiety and Panic Attacks Your caregiver has informed you that you are having an anxiety or panic attack. There may be many forms of this. Most of the time these attacks come suddenly and without warning. They come at any time of day, including periods of sleep, and at any time of life. They may be strong and unexplained. Although panic attacks are very scary, they are physically harmless. Sometimes the cause of your anxiety is not known. Anxiety is a protective mechanism of the body in its fight or flight mechanism. Most of these perceived danger situations are actually nonphysical situations (such as anxiety over losing a job). CAUSES  The causes of an anxiety or panic attack are many. Panic attacks may occur in otherwise healthy people given a certain set of circumstances. There may be a genetic cause for panic attacks. Some medications may also have anxiety as a side effect. SYMPTOMS  Some of the most common feelings are:  Intense terror.  Dizziness, feeling faint.  Hot and cold flashes.  Fear of going crazy.  Feelings that nothing is real.  Sweating.  Shaking.  Chest pain or a fast heartbeat (palpitations).  Smothering, choking sensations.  Feelings of impending doom and that death is near.  Tingling of extremities, this may be from over-breathing.  Altered reality (derealization).  Being detached from yourself (depersonalization). Several symptoms can be present to make up anxiety or panic attacks. DIAGNOSIS  The evaluation by your caregiver will depend on the type of  symptoms you are experiencing. The diagnosis of anxiety or panic attack is made when no physical illness can be determined to be a cause of the symptoms. TREATMENT  Treatment to prevent anxiety and panic attacks may include:  Avoidance of circumstances that cause anxiety.  Reassurance and relaxation.  Regular exercise.  Relaxation therapies, such as yoga.  Psychotherapy with a psychiatrist or therapist.  Avoidance of caffeine, alcohol and illegal drugs.  Prescribed medication. SEEK IMMEDIATE MEDICAL CARE IF:   You experience panic attack symptoms that are different than your usual symptoms.  You have any worsening or concerning symptoms. Document Released: 11/12/2005 Document Revised: 02/04/2012 Document Reviewed: 03/16/2010 [facility] Patient Information 2014 [place], [place].     Palpitations  A palpitation is the feeling that your heartbeat is irregular or is faster than normal. It may feel like your heart is fluttering or skipping a beat. Palpitations are usually not a serious problem. However, in some cases, you may need further medical evaluation. CAUSES  Palpitations can be caused by:  Smoking.  Caffeine or other stimulants, such as diet pills or energy drinks.  Alcohol.  Stress and anxiety.  Strenuous physical activity.  Fatigue.  Certain medicines.  Heart disease, especially if you have a history of arrhythmias. This includes atrial fibrillation, atrial flutter, or supraventricular tachycardia.  An improperly working pacemaker or defibrillator. DIAGNOSIS  To find the cause of your palpitations, your caregiver will take your history and perform a physical exam. Tests may also be done, including:  Electrocardiography (ECG). This test records the heart's electrical activity.  Cardiac monitoring. This allows your caregiver to monitor your heart rate and rhythm in real time.  Holter monitor. This is a portable device that records your heartbeat and can help  diagnose heart arrhythmias. It allows your caregiver to track your heart activity for several days, if needed.  Stress tests by exercise or by giving medicine that makes the heart beat faster. TREATMENT  Treatment of palpitations depends on the cause of your symptoms and can vary greatly. Most cases of palpitations do not require any treatment other than time, relaxation, and monitoring your symptoms. Other causes, such as atrial fibrillation, atrial flutter, or supraventricular tachycardia, usually require further treatment. HOME CARE INSTRUCTIONS   Avoid:  Caffeinated coffee, tea, soft drinks, diet pills, and energy drinks.  Chocolate.  Alcohol.  Stop smoking if you smoke.  Reduce your stress and anxiety. Things that can help you relax include:  A method that measures bodily functions so you can learn to control them (biofeedback).  Yoga.  Meditation.  Physical activity such as swimming, jogging, or walking.  Get plenty of rest and sleep. SEEK MEDICAL CARE IF:   You continue to have a fast or irregular heartbeat beyond 24 hours.  Your palpitations occur more often. SEEK IMMEDIATE MEDICAL CARE IF:  You develop chest pain or shortness of breath.  You have a severe headache.  You feel dizzy, or you faint. MAKE SURE YOU:  Understand these instructions.  Will watch your condition.  Will get help right away if you are not doing well or get worse. Document Released: 11/09/2000 Document Revised: 05/13/2012 Document Reviewed: 01/11/2012 [facility] Patient Information 2014 [place], [place].

## 2013-05-24 ENCOUNTER — Other Ambulatory Visit: Payer: Self-pay | Admitting: Family Medicine

## 2013-06-22 ENCOUNTER — Encounter (INDEPENDENT_AMBULATORY_CARE_PROVIDER_SITE_OTHER): Payer: BC Managed Care – PPO

## 2013-06-22 ENCOUNTER — Encounter: Payer: Self-pay | Admitting: *Deleted

## 2013-06-22 ENCOUNTER — Encounter: Payer: Self-pay | Admitting: Cardiology

## 2013-06-22 ENCOUNTER — Ambulatory Visit (HOSPITAL_COMMUNITY): Payer: BC Managed Care – PPO | Attending: Cardiology

## 2013-06-22 ENCOUNTER — Ambulatory Visit (INDEPENDENT_AMBULATORY_CARE_PROVIDER_SITE_OTHER): Payer: BC Managed Care – PPO | Admitting: Cardiology

## 2013-06-22 VITALS — BP 136/94 | HR 88 | Wt 280.0 lb

## 2013-06-22 DIAGNOSIS — R002 Palpitations: Secondary | ICD-10-CM

## 2013-06-22 DIAGNOSIS — I10 Essential (primary) hypertension: Secondary | ICD-10-CM

## 2013-06-22 DIAGNOSIS — R42 Dizziness and giddiness: Secondary | ICD-10-CM

## 2013-06-22 NOTE — Assessment & Plan Note (Signed)
Continue present blood pressure medications and follow. Echocardiogram to assess wall thickness and LV function.

## 2013-06-22 NOTE — Patient Instructions (Signed)
Your physician recommends that you schedule a follow-up appointment in: 8-10 WEEKS WITH DR Jens Som  Your physician has recommended that you wear an event monitor. Event monitors are medical devices that record the heart's electrical activity. Doctors most often [place] these monitors to diagnose arrhythmias. Arrhythmias are problems with the speed or rhythm of the heartbeat. The monitor is a small, portable device. You can wear one while you do your normal daily activities. This is usually used to diagnose what is causing palpitations/syncope (passing out).   Your physician recommends that you HAVE LAB WORK TODAY  Your physician has requested that you have an echocardiogram. Echocardiography is a painless test that uses sound waves to create images of your heart. It provides your doctor with information about the size and shape of your heart and how well your heart's chambers and valves are working. This procedure takes approximately one hour. There are no restrictions for this procedure.

## 2013-06-22 NOTE — Progress Notes (Signed)
Echocardiogram performed.  
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
## 2013-06-22 NOTE — Progress Notes (Signed)
Patient ID: Jeffery Nielsen, male   DOB: September 10, 1966, 47 y.o.   MRN: [id number] E-Cardio verite 30 day cardiac event monitor applied to patient.

## 2013-06-22 NOTE — Assessment & Plan Note (Signed)
Etiology unclear.I will arrange a CardioNet and a TSH. Echocardiogram to assess LV function.

## 2013-07-29 ENCOUNTER — Telehealth: Payer: Self-pay | Admitting: *Deleted

## 2013-07-29 NOTE — Telephone Encounter (Signed)
Left message for pt to call, monitor reviewed by dr Jens Som shows sinus

## 2013-08-11 ENCOUNTER — Other Ambulatory Visit: Payer: Self-pay | Admitting: Family Medicine

## 2013-08-17 ENCOUNTER — Ambulatory Visit: Payer: BC Managed Care – PPO | Admitting: Cardiology

## 2013-08-20 NOTE — Telephone Encounter (Signed)
pt aware of results  

## 2013-08-28 ENCOUNTER — Encounter: Payer: Self-pay | Admitting: Family Medicine

## 2013-08-28 ENCOUNTER — Ambulatory Visit (INDEPENDENT_AMBULATORY_CARE_PROVIDER_SITE_OTHER): Payer: BC Managed Care – PPO | Admitting: Family Medicine

## 2013-08-28 VITALS — BP 147/81 | HR 81 | Temp 98.2°F | Wt 280.0 lb

## 2013-08-28 DIAGNOSIS — L0231 Cutaneous abscess of buttock: Secondary | ICD-10-CM

## 2013-08-28 MED ORDER — DOXYCYCLINE HYCLATE 100 MG PO TABS: 100.0000 mg | ORAL_TABLET | Freq: Two times a day (BID) | ORAL | Status: DC

## 2013-08-28 NOTE — Patient Instructions (Addendum)
Pull an inch or two of the packing out each day after a warm water soapless soak in the tub. Then cover area again. Repeat x 5 days or until falls out (if doesn't fall out then you can pull it out at that time)  See [place] for any of red flags we discussed (see below as well for return reasons).   Take antibiotics for 7 days.   See Dr. Deirdre Priest in the next few weeks to follow up on blood pressure.   Health Maintenance Due  Topic Date Due  . Influenza Vaccine  06/26/2013     Abscess An abscess is an infected area that contains a collection of pus and debris.It can occur in almost any part of the body. An abscess is also known as a furuncle or boil. CAUSES  An abscess occurs when tissue gets infected. This can occur from blockage of oil or sweat glands, infection of hair follicles, or a minor injury to the skin. As the body tries to fight the infection, pus collects in the area and creates pressure under the skin. This pressure causes pain. People with weakened immune systems have difficulty fighting infections and get certain abscesses more often.  SYMPTOMS Usually an abscess develops on the skin and becomes a painful mass that is red, warm, and tender. If the abscess forms under the skin, you may feel a moveable soft area under the skin. Some abscesses break open (rupture) on their own, but most will continue to get worse without care. The infection can spread deeper into the body and eventually into the bloodstream, causing you to feel ill.  DIAGNOSIS  Your caregiver will take your medical history and perform a physical exam. A sample of fluid may also be taken from the abscess to determine what is causing your infection. TREATMENT  Your caregiver may prescribe antibiotic medicines to fight the infection. However, taking antibiotics alone usually does not cure an abscess. Your caregiver may need to make a small cut (incision) in the abscess to drain the pus. In some cases, gauze is packed into  the abscess to reduce pain and to continue draining the area. HOME CARE INSTRUCTIONS   Only take over-the-counter or prescription medicines for pain, discomfort, or fever as directed by your caregiver.  If you were prescribed antibiotics, take them as directed. Finish them even if you start to feel better.  If gauze is used, follow your caregiver's directions for changing the gauze.  To avoid spreading the infection:  Keep your draining abscess covered with a bandage.  Wash your hands well.  Do not share personal care items, towels, or whirlpools with others.  Avoid skin contact with others.  Keep your skin and clothes clean around the abscess.  Keep all follow-up appointments as directed by your caregiver. SEEK MEDICAL CARE IF:   You have increased pain, swelling, redness, fluid drainage, or bleeding.  You have muscle aches, chills, or a general ill feeling.  You have a fever. MAKE SURE YOU:   Understand these instructions.  Will watch your condition.  Will get help right away if you are not doing well or get worse. Document Released: 08/22/2005 Document Revised: 05/13/2012 Document Reviewed: 01/25/2012 [facility] Patient Information 2014 [place], [place].  Abscess Care After An abscess (also called a boil or furuncle) is an infected area that contains a collection of pus. Signs and symptoms of an abscess include pain, tenderness, redness, or hardness, or you may feel a moveable soft area under your skin.  An abscess can occur anywhere in the body. The infection may spread to surrounding tissues causing cellulitis. A cut (incision) by the surgeon was made over your abscess and the pus was drained out. Gauze may have been packed into the space to provide a drain that will allow the cavity to heal from the inside outwards. The boil may be painful for 5 to 7 days. Most people with a boil do not have high fevers. Your abscess, if seen early, may not have localized, and may not have  been lanced. If not, another appointment may be required for this if it does not get better on its own or with medications. HOME CARE INSTRUCTIONS   Only take over-the-counter or prescription medicines for pain, discomfort, or fever as directed by your caregiver.  When you bathe, soak and then remove gauze or iodoform packs at least daily or as directed by your caregiver. You may then wash the wound gently with mild soapy water. Repack with gauze or do as your caregiver directs. SEEK IMMEDIATE MEDICAL CARE IF:   You develop increased pain, swelling, redness, drainage, or bleeding in the wound site.  You develop signs of generalized infection including muscle aches, chills, fever, or a general ill feeling.  An oral temperature above 102 F (38.9 C) develops, not controlled by medication. See your caregiver for a recheck if you develop any of the symptoms described above. If medications (antibiotics) were prescribed, take them as directed. Document Released: 05/31/2005 Document Revised: 02/04/2012 Document Reviewed: 01/26/2008 [facility] Patient Information 2014 [place], [place].

## 2013-08-29 NOTE — Addendum Note (Signed)
Addended by: Shelva Majestic on: 08/29/2013 06:07 PM   Modules accepted: Level of Service

## 2013-08-29 NOTE — Progress Notes (Signed)
[facility] Tana Conch, MD Phone: [phone]  Subjective:  Chief complaint-noted  # Boil/abscess Location-left upper buttocks Quality- aching pain Severity- severe 10/10 Duration- started a week ago Timing- slowly worsening/enlarging Context- thought he had a "bug bite" at first but area became progressively tender Modifying factors- OTC pain relievers. Has noted small amounts of pus and blood as drainage. Worse with sitting directly on area or sleeping on it.  ROS- no fever/chills/nausea/vomiting. Does not feel ill other than pain on buttocks.   Past Medical History-hypertension, intermittent asthma, obesity  Medications- reviewed and updated Current Outpatient Prescriptions on File Prior to Visit  Medication Sig Dispense Refill  . albuterol (PROAIR HFA) 108 (90 BASE) MCG/ACT inhaler Inhale 2 puffs into the lungs every 4 (four) hours as needed. Shortness of breath. Do not use regularly  1 Inhaler  6  . aspirin 325 MG tablet Take 325 mg by mouth daily.      . enalapril (VASOTEC) 20 MG tablet TAKE 1 TABLET BY MOUTH TWICE A DAY  60 tablet  2  . hydrochlorothiazide (MICROZIDE) 12.5 MG capsule TAKE 1 CAPSULE (12.5 MG TOTAL) BY MOUTH DAILY.  30 capsule  1  . propranolol (INDERAL) 10 MG tablet Take 1 tablet (10 mg total) by mouth 3 (three) times daily as needed (Palpitations).  20 tablet  0   No current facility-administered medications on file prior to visit.    Objective: BP 147/81  Pulse 81  Temp(Src) 98.2 F (36.8 C) (Oral)  Wt 280 lb (127.007 kg)  BMI 39.07 kg/m2 Gen: appears uncomfortable when initially sits and when abscess is touched CV: RRR no murmurs rubs or gallops Lungs: CTAB no crackles, wheeze, rhonchi Skin:  Left upper buttocks with large abscess with induration and redness 7cm by 6 cm with head/pustule approximately 3-4 cm to left of buttocks crease. Tender to touch, central area noted to have pustule.    Procedure:  Incision and  drainage of abscess Anselm Lis, MD, PGY-1 assisted on procedure.  Risks, benefits, and alternatives explained and consent obtained. Time out conducted. Surface cleaned with alcohol. 8 cc lidocaine with epinephine infiltrated around abscess. Adequate anesthesia ensured. Area prepped in a sterile fashion. #11 blade used to make a stab incision into abscess. Pus expressed with pressure. Curved hemostat used to explore 4 quadrants and loculations broken up. Further purulence expressed. iodoform packing placed leaving a 1-inch tail. Hemostasis achieved. Pt stable. Aftercare and follow-up advised.  Assessment/Plan:  # Boil/abscess of left upper buttocks I+D performed in office as above. No history of abscess in this location, far enough from midline and no history of similar lesion that this was not thought to be a pilonidal cyst GIven >5 cm in diameter, patient given 7 days of doxycycline to ensure wound healing.  Home care instructions provided including daily sitz baths then pulling an inch of packing out per day for 5 days until falls out or can pull out at that time.  Return precautions also discussed.

## 2013-09-29 ENCOUNTER — Encounter: Payer: BC Managed Care – PPO | Admitting: Cardiology
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
## 2013-10-19 ENCOUNTER — Other Ambulatory Visit: Payer: Self-pay | Admitting: Family Medicine

## 2013-10-29 ENCOUNTER — Encounter: Payer: Self-pay | Admitting: Cardiology

## 2013-12-15 ENCOUNTER — Encounter: Payer: Self-pay | Admitting: Cardiology

## 2013-12-15 ENCOUNTER — Ambulatory Visit (INDEPENDENT_AMBULATORY_CARE_PROVIDER_SITE_OTHER): Payer: BC Managed Care – PPO | Admitting: Cardiology

## 2013-12-15 VITALS — BP 136/100 | HR 85 | Ht 71.0 in | Wt 292.8 lb

## 2013-12-15 DIAGNOSIS — I10 Essential (primary) hypertension: Secondary | ICD-10-CM

## 2013-12-15 DIAGNOSIS — R002 Palpitations: Secondary | ICD-10-CM

## 2013-12-15 MED ORDER — METOPROLOL SUCCINATE ER 50 MG PO TB24: 50.0000 mg | ORAL_TABLET | Freq: Every day | ORAL | Status: DC

## 2013-12-15 NOTE — Patient Instructions (Signed)
Your physician wants you to follow-up in: 6 MONTHS WITH DR Jens SomRENSHAW You will receive a reminder letter in the mail two months in advance. If you don't receive a letter, please call our office to schedule the follow-up appointment.   STOP PROPRANOLOL  START METOPROLOL SUCC ER 50 MG ONCE DAILY

## 2013-12-15 NOTE — Assessment & Plan Note (Signed)
Blood pressure elevated. Add Toprol 50 mg daily. Increase medications as needed.

## 2013-12-15 NOTE — Assessment & Plan Note (Signed)
Patient is having worsening palpitations. Previous monitor unremarkable but not clear he had symptoms with it in place. LV function is normal. Discontinue propranolol. Add Toprol 50 mg daily and increase as needed. Will consider repeating monitor if symptoms worsen.
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
## 2013-12-22 ENCOUNTER — Other Ambulatory Visit: Payer: Self-pay | Admitting: Family Medicine

## 2013-12-28 ENCOUNTER — Encounter: Payer: BC Managed Care – PPO | Admitting: Family Medicine

## 2014-01-13 ENCOUNTER — Ambulatory Visit (INDEPENDENT_AMBULATORY_CARE_PROVIDER_SITE_OTHER): Payer: BC Managed Care – PPO | Admitting: Family Medicine

## 2014-01-13 ENCOUNTER — Encounter: Payer: Self-pay | Admitting: Family Medicine

## 2014-01-13 VITALS — BP 160/92 | HR 84 | Temp 98.6°F | Wt 293.0 lb

## 2014-01-13 DIAGNOSIS — R002 Palpitations: Secondary | ICD-10-CM

## 2014-01-13 DIAGNOSIS — J45909 Unspecified asthma, uncomplicated: Secondary | ICD-10-CM

## 2014-01-13 DIAGNOSIS — I10 Essential (primary) hypertension: Secondary | ICD-10-CM

## 2014-01-13 DIAGNOSIS — E669 Obesity, unspecified: Secondary | ICD-10-CM

## 2014-01-13 NOTE — Progress Notes (Signed)
   Subjective:    Patient ID: Jeffery Nielsen, male    DOB: 07-03-1966, 48 y.o.   MRN: [id number]  HPI  Feeling well except is gaining weight and has not been exercising lately  HYPERTENSION Disease Monitoring Home BP Monitoring not checking Chest pain- no    Dyspnea- no Medications Compliance-  Has not been taking HCTZ and just took enalapril on way to office visit today. Lightheadedness-  no  Edema- no   Asthma Using proair once a week and not using flovent.  Occls wheeze and shortness of breath with exercise but none regularly or at PM  Palpitation Better on metoprolol higher dose.  No lightheadness or syncope or shortness of breath   Obesity Had been exercising and dieting after seeing cardiologist but stopped 1-2 months ago.  Indicates he understand the importance.   Not interested in seeing nutritionist now.     ROS - See HPI  PMH Lab Review   Potassium  Date Value Ref Range Status  04/28/2013 4.1  3.5 - 5.3 mEq/L Final     Sodium  Date Value Ref Range Status  04/28/2013 139  135 - 145 mEq/L Final     Creat  Date Value Ref Range Status  04/28/2013 1.09  0.50 - 1.35 mg/dL Final     Creatinine, Ser  Date Value Ref Range Status  12/29/2009 0.91  0.40-1.50 mg/dL Final        Review of Systems     Objective:   Physical Exam Alert no acute distress obese Heart - Regular rate and rhythm.  No murmurs, gallops or rubs.    Lungs:  Normal respiratory effort, chest expands symmetrically. Lungs are clear to auscultation, no crackles or wheezes. Abdomen: soft and non-tender without masses, organomegaly or hernias noted.  No guarding or rebound Extremities:  No cyanosis, edema, or deformity noted with good range of motion of all major joints.          Assessment & Plan:

## 2014-01-13 NOTE — Patient Instructions (Signed)
Good to see you today!  Thanks for coming in.  Check your blood pressure regularly if not usually below 140/90 then call us  Take your Hctz 12.5 mg every day - if the urination  Still bothers you after 2-3 weeks call me  Controlling your weight is your major health goal - we can refer your to a nutrititionist if you wuold like  Come back in June for blood pressure and blood tests

## 2014-01-13 NOTE — Assessment & Plan Note (Signed)
Not at goal due to not taking medications See AVS for plans

## 2014-01-13 NOTE — Assessment & Plan Note (Signed)
Improved.  Continue BB.  No evidence of CAD or malignant arrrythmias

## 2014-01-13 NOTE — Assessment & Plan Note (Signed)
Worsening Discussed approaches and rationale

## 2014-01-13 NOTE — Assessment & Plan Note (Signed)
Stable intermmitent does not seem to be effected by betablocker

## 2014-02-11 ENCOUNTER — Other Ambulatory Visit: Payer: Self-pay | Admitting: Family Medicine

## 2014-04-06 ENCOUNTER — Ambulatory Visit (INDEPENDENT_AMBULATORY_CARE_PROVIDER_SITE_OTHER): Payer: BC Managed Care – PPO | Admitting: Family Medicine

## 2014-04-06 ENCOUNTER — Ambulatory Visit: Payer: BC Managed Care – PPO | Admitting: Family Medicine

## 2014-04-06 ENCOUNTER — Encounter: Payer: Self-pay | Admitting: Family Medicine

## 2014-04-06 VITALS — BP 131/88 | HR 84 | Temp 98.0°F | Wt 268.0 lb

## 2014-04-06 DIAGNOSIS — L0292 Furuncle, unspecified: Secondary | ICD-10-CM | POA: Insufficient documentation

## 2014-04-06 DIAGNOSIS — L0293 Carbuncle, unspecified: Secondary | ICD-10-CM

## 2014-04-06 LAB — POCT GLYCOSYLATED HEMOGLOBIN (HGB A1C): HEMOGLOBIN A1C: 5.7

## 2014-04-06 MED ORDER — DOXYCYCLINE HYCLATE 100 MG PO TABS: 100.0000 mg | ORAL_TABLET | Freq: Two times a day (BID) | ORAL | Status: DC

## 2014-04-06 NOTE — Assessment & Plan Note (Signed)
You have 4 boils under your L arm ranging from 5 mm to 2 cm. None ready for incision and drainage today.  Screening A1c today.   For treatment: 1. Ibuprofen  2. Doxycycline 100 mg twice daily for 7-10 days  3. Warm compresses   Call come back for worsening pain, swelling, fullness suggestive need for incision and drainage.

## 2014-04-06 NOTE — Progress Notes (Signed)
   Subjective:    Patient ID: Jeffery Nielsen, male    DOB: 08-11-1966, 48 y.o.   MRN: [id number] CC: boils L axilla.  HPI 48 yo M presents for SD visit:   1. L axillary boil: patient with painful swelling under L axilla x 3 weeks, solitary lesion. One week ago developed 3 new lesions. Intermittent drainage from primary lesion. Pain was 10/10 yesterday. 7/10 pain today. No fever. Patient is exercising to lose weight. Has history of gluteal boils. Warm compresses help with pain.   Soc Hx: non smoker  Review of Systems As per HPI     Objective:   Physical Exam BP 131/88  Pulse 84  Temp(Src) 98 F (36.7 C) (Oral)  Wt 268 lb (121.564 kg) Wt Readings from Last 3 Encounters:  04/06/14 268 lb (121.564 kg)  01/13/14 293 lb (132.904 kg)  12/15/13 292 lb 12.8 oz (132.813 kg)  General appearance: alert, cooperative and no distress Skin: normal R axilla  or 4 indurated boils under L axilla measuring 5 mm x 1, 1 cm x 1 and 2 cm x 2, overlying erythema. no drainage or fluctuance.      Assessment & Plan:

## 2014-04-06 NOTE — Patient Instructions (Signed)
Mr. Noelle PennerGibbs,  Thank you for coming in today. It was a pleasure meeting you.   You have 4 boils under your L arm ranging from 5 mm to 2 cm. None ready to drain today.  For treatment: 1. Ibuprofen  2. Doxycycline 100 mg twice daily for 7-10 days  3. Warm compresses   Call come back for worsening pain, swelling, fullness suggestive need for incision and drainage.  Dr. Armen PickupFunches

## 2014-06-13 ENCOUNTER — Other Ambulatory Visit: Payer: Self-pay | Admitting: Family Medicine

## 2014-06-24 ENCOUNTER — Other Ambulatory Visit: Payer: Self-pay | Admitting: Family Medicine

## 2014-07-21 ENCOUNTER — Ambulatory Visit (INDEPENDENT_AMBULATORY_CARE_PROVIDER_SITE_OTHER): Payer: PRIVATE HEALTH INSURANCE | Admitting: Cardiology

## 2014-07-21 ENCOUNTER — Encounter: Payer: Self-pay | Admitting: Cardiology

## 2014-07-21 VITALS — BP 138/84 | HR 72 | Ht 71.0 in | Wt 274.6 lb

## 2014-07-21 DIAGNOSIS — R002 Palpitations: Secondary | ICD-10-CM

## 2014-07-21 NOTE — Assessment & Plan Note (Signed)
Blood pressure controlled. Continue present medications. 
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
## 2014-07-21 NOTE — Patient Instructions (Signed)
Your physician recommends that you schedule a follow-up appointment in: AS NEEDED  

## 2014-07-21 NOTE — Assessment & Plan Note (Signed)
Symptoms much improved. Continue Toprol for palpitations and blood pressure.

## 2015-02-21 ENCOUNTER — Other Ambulatory Visit: Payer: Self-pay

## 2015-02-21 DIAGNOSIS — I10 Essential (primary) hypertension: Secondary | ICD-10-CM

## 2015-02-21 DIAGNOSIS — R002 Palpitations: Secondary | ICD-10-CM

## 2015-02-21 MED ORDER — METOPROLOL SUCCINATE ER 50 MG PO TB24: 50.0000 mg | ORAL_TABLET | Freq: Every day | ORAL | Status: DC

## 2015-05-14 ENCOUNTER — Other Ambulatory Visit: Payer: Self-pay | Admitting: Family Medicine

## 2015-07-07 ENCOUNTER — Other Ambulatory Visit: Payer: Self-pay | Admitting: Family Medicine

## 2015-10-04 ENCOUNTER — Other Ambulatory Visit: Payer: Self-pay | Admitting: Family Medicine

## 2015-10-10 ENCOUNTER — Emergency Department (HOSPITAL_COMMUNITY)
Admission: EM | Admit: 2015-10-10 | Discharge: 2015-10-10 | Disposition: A | Discharge status: Home / Self Care | Payer: Commercial Managed Care - HMO | Attending: Emergency Medicine | Admitting: Emergency Medicine

## 2015-10-10 ENCOUNTER — Emergency Department (HOSPITAL_COMMUNITY): Payer: Commercial Managed Care - HMO

## 2015-10-10 ENCOUNTER — Encounter (HOSPITAL_COMMUNITY): Payer: Self-pay | Admitting: Emergency Medicine

## 2015-10-10 DIAGNOSIS — Z792 Long term (current) use of antibiotics: Secondary | ICD-10-CM | POA: Insufficient documentation

## 2015-10-10 DIAGNOSIS — Z7982 Long term (current) use of aspirin: Secondary | ICD-10-CM | POA: Diagnosis not present

## 2015-10-10 DIAGNOSIS — Z880 Allergy status to penicillin: Secondary | ICD-10-CM | POA: Insufficient documentation

## 2015-10-10 DIAGNOSIS — J45909 Unspecified asthma, uncomplicated: Secondary | ICD-10-CM | POA: Diagnosis not present

## 2015-10-10 DIAGNOSIS — Z79899 Other long term (current) drug therapy: Secondary | ICD-10-CM | POA: Insufficient documentation

## 2015-10-10 DIAGNOSIS — R51 Headache: Secondary | ICD-10-CM | POA: Insufficient documentation

## 2015-10-10 DIAGNOSIS — I10 Essential (primary) hypertension: Secondary | ICD-10-CM | POA: Diagnosis not present

## 2015-10-10 DIAGNOSIS — Z7952 Long term (current) use of systemic steroids: Secondary | ICD-10-CM | POA: Diagnosis not present

## 2015-10-10 DIAGNOSIS — R42 Dizziness and giddiness: Secondary | ICD-10-CM | POA: Diagnosis present

## 2015-10-10 LAB — CBC WITH DIFFERENTIAL/PLATELET
BASOS ABS: 0 10*3/uL (ref 0.0–0.1)
BASOS PCT: 0 %
EOS ABS: 0 10*3/uL (ref 0.0–0.7)
EOS PCT: 0 %
HCT: 46 % (ref 39.0–52.0)
Hemoglobin: 15.6 g/dL (ref 13.0–17.0)
LYMPHS PCT: 38 %
Lymphs Abs: 2.8 10*3/uL (ref 0.7–4.0)
MCH: 30.7 pg (ref 26.0–34.0)
MCHC: 33.9 g/dL (ref 30.0–36.0)
MCV: 90.6 fL (ref 78.0–100.0)
Monocytes Absolute: 0.4 10*3/uL (ref 0.1–1.0)
Monocytes Relative: 5 %
Neutro Abs: 4.2 10*3/uL (ref 1.7–7.7)
Neutrophils Relative %: 57 %
PLATELETS: 257 10*3/uL (ref 150–400)
RBC: 5.08 MIL/uL (ref 4.22–5.81)
RDW: 12.6 % (ref 11.5–15.5)
WBC: 7.4 10*3/uL (ref 4.0–10.5)

## 2015-10-10 LAB — BASIC METABOLIC PANEL
ANION GAP: 7 (ref 5–15)
BUN: 12 mg/dL (ref 6–20)
CALCIUM: 9.5 mg/dL (ref 8.9–10.3)
CO2: 28 mmol/L (ref 22–32)
Chloride: 103 mmol/L (ref 101–111)
Creatinine, Ser: 0.99 mg/dL (ref 0.61–1.24)
GFR calc Af Amer: >60 mL/min (ref >60)
Glucose, Bld: 111 mg/dL — ABNORMAL HIGH (ref 65–99)
POTASSIUM: 4.6 mmol/L (ref 3.5–5.1)
SODIUM: 138 mmol/L (ref 135–145)

## 2015-10-10 MED ORDER — MECLIZINE HCL 25 MG PO TABS
50.0000 mg | ORAL_TABLET | Freq: Once | ORAL | Status: AC
  Administered 2015-10-10: 50 mg via ORAL
  Filled 2015-10-10: qty 2

## 2015-10-10 MED ORDER — DIAZEPAM 5 MG PO TABS: 5.0000 mg | ORAL_TABLET | Freq: Four times a day (QID) | ORAL | Status: DC | PRN

## 2015-10-10 MED ORDER — SODIUM CHLORIDE 0.9 % IV BOLUS (SEPSIS)
1000.0000 mL | Freq: Once | INTRAVENOUS | Status: AC
  Administered 2015-10-10: 1000 mL via INTRAVENOUS

## 2015-10-10 MED ORDER — DIAZEPAM 5 MG/ML IJ SOLN
5.0000 mg | Freq: Once | INTRAMUSCULAR | Status: AC
  Administered 2015-10-10: 5 mg via INTRAVENOUS
  Filled 2015-10-10: qty 2
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
## 2015-10-10 NOTE — ED Notes (Signed)
Pt c/o dizziness that has been going on since last week.  Pt states that today when he stood up felt very lightheaded.  Pt has PMH HTN and takes his medications as ordered per pt.

## 2015-10-19 ENCOUNTER — Encounter: Payer: Self-pay | Admitting: Family Medicine

## 2015-10-19 ENCOUNTER — Ambulatory Visit (INDEPENDENT_AMBULATORY_CARE_PROVIDER_SITE_OTHER): Payer: Commercial Managed Care - HMO | Admitting: Family Medicine

## 2015-10-19 VITALS — BP 138/83 | HR 81 | Temp 98.1°F | Ht 71.0 in | Wt 282.2 lb

## 2015-10-19 DIAGNOSIS — H8113 Benign paroxysmal vertigo, bilateral: Secondary | ICD-10-CM

## 2015-10-19 DIAGNOSIS — H9312 Tinnitus, left ear: Secondary | ICD-10-CM

## 2015-10-19 NOTE — Progress Notes (Signed)
Patient ID: Jeffery Nielsen, male   DOB: 1966-01-30, 49 y.o.   MRN: [id number]   Subjective: CC:  Dizziness HPI: This history was provided by the patient.  Patient's PMH significant for hypertension and asthma.  Patient is a 49 y.o. male presenting to clinic today for follow-up after an ED visit on 10/10/15 where he was evaluated for dizziness and ringing in his ears.  Patient states left ear ringing began on 11/10 and when he presented to ED on 11/14, he had a sensation of being light headed with standing.  Patient denies overt dizziness, stating, "I wasn't spinning and the room wasn't spinning."  Patient states, "It was like my body was pulling one way when I stood up."  Patient denies headache, changes in vision, N/V and generalized or unilateral weakness with episode or currently.  Patient also denies fever, cough, congestion and ear pain or fullness.  Patient denies current ringing in his ears and dizziness.  Patient was referred to an ENT practice from the ED.  He saw the ENT physician on 11/22 and was given a prescription for a prednisone pack and meclizine.  Patient is uncertain as to why he was given prednisone.  Patient states he is taking his blood pressure medications on a daily basis.  Patient states since the ED visit he has checked his BP at home on a daily basis and the highest reading was 143/80.    Concerns today include:  1. Dizziness  Social History Reviewed: Never smoker. FamHx and MedHx updated.  Please see EMR. Health Maintenance: Discussed coloscopy with patient and gave him information to schedule one.  Discussed weight management with patient.    ROS: Per HPI  Objective: Office vital signs reviewed. BP 138/83 mmHg  Pulse 81  Temp(Src) 98.1 F (36.7 C) (Oral)  Ht 5\' 11"  (1.803 m)  Wt 282 lb 3 oz (127.999 kg)  BMI 39.37 kg/m2  Physical Examination:  General: Pleasant African American male who appears stated age.  Awake, alert, well-nourished, NAD HEENT: Normal  Ears: TMs intact, normal light reflex, no erythema, no bulging, no otorrhea. Cardio: RRR, S1S2 heard, no murmurs appreciated, no pre-tibial or pedal edema, cyanosis or clubbing; +2 radial pulses bilaterally Pulm: CTAB, no wheezes, rhonchi or rales Neuro: Strength and sensation grossly intact, CNII-XII grossly intact.  No dysmetria or nystagmus noted.  Assessment/ Plan: 49 y.o. male presents for follow-up after being evaluated in the ED for dizziness and tinnitus last week. Patient's workup in the ED included an MRI and EKG, both of which were unremarkable.  Patient denies current signs and symptoms of dizziness and tinnitus.  Patient is monitoring BP at home and states it is consistently WNL.  1. Ear ringing, left  2. Benign paroxysmal positional vertigo, bilateral Discussed with patient that this condition may be recurrent and if that happens, change positions slowly, be aware of fall risk.  Patient to continue prednisone as prescribed by ENT and meclizine as needed for vertigo.  Patient voiced understanding of and agreement with this plan.   Follow-up: Patient instructed to follow-up immediately should he develop dizziness with unilateral weakness, changes in speech, N/V, changes in vision or headache.     Nelly RoutLaura Analeah Brame, NP [facility]  I was present during the majority of the history and confirmed the remainder and was present during exam and decision making.  I reviewed and agree with the above CHAMBLISS,MARSHALL L

## 2015-10-19 NOTE — Patient Instructions (Signed)
Good to see you today!  Thanks for coming in.  Monitor your blood pressure should be < 140/90   Get a colonoscopy  Aim to watch your weight  If you have any focal weakness or visual loss or trouble speaking the go to the ER,  If the dizziness comes back mildly then can use the meclizine

## 2015-10-22 ENCOUNTER — Other Ambulatory Visit: Payer: Self-pay | Admitting: Family Medicine

## 2016-01-14 ENCOUNTER — Other Ambulatory Visit: Payer: Self-pay | Admitting: Family Medicine

## 2016-03-14 ENCOUNTER — Encounter: Payer: Self-pay | Admitting: Family Medicine

## 2016-03-14 ENCOUNTER — Ambulatory Visit (INDEPENDENT_AMBULATORY_CARE_PROVIDER_SITE_OTHER): Payer: Commercial Managed Care - HMO | Admitting: Family Medicine

## 2016-03-14 VITALS — BP 142/90 | HR 76 | Temp 98.4°F | Wt 290.4 lb

## 2016-03-14 DIAGNOSIS — Z1159 Encounter for screening for other viral diseases: Secondary | ICD-10-CM

## 2016-03-14 DIAGNOSIS — E669 Obesity, unspecified: Secondary | ICD-10-CM

## 2016-03-14 DIAGNOSIS — Z1211 Encounter for screening for malignant neoplasm of colon: Secondary | ICD-10-CM | POA: Diagnosis not present

## 2016-03-14 DIAGNOSIS — I10 Essential (primary) hypertension: Secondary | ICD-10-CM

## 2016-03-14 DIAGNOSIS — J4520 Mild intermittent asthma, uncomplicated: Secondary | ICD-10-CM | POA: Diagnosis not present

## 2016-03-14 DIAGNOSIS — Z125 Encounter for screening for malignant neoplasm of prostate: Secondary | ICD-10-CM

## 2016-03-14 NOTE — Progress Notes (Signed)
   Subjective:    Patient ID: Jeffery Nielsen, male    DOB: Apr 23, 1966, 50 y.o.   MRN: [id number]  HPI  [place] job [place].   Have all resolved  HYPERTENSION Disease Monitoring Home BP Monitoring (Severity) not checking Symptoms - Chest pain- none recurrent or with exercise    Dyspnea- no Medications(Modifying factors) Compliance-  regularly. Lightheadedness-  no  Edema- no Timing - continuous  Duration - years ROS - See HPI  Asthma - well controlled  Patient reports no  vision/ hearing changes,anorexia, weight change, fever ,adenopathy, persistant / recurrent hoarseness, swallowing issues, chest pain, edema,persistant / recurrent cough, hemoptysis, dyspnea(rest, exertional, paroxysmal nocturnal), gastrointestinal  bleeding (melena, rectal bleeding), abdominal pain, excessive heart burn, GU symptoms(dysuria, hematuria, pyuria, voiding/incontinence  Issues) syncope, focal weakness, severe memory loss, concerning skin lesions, depression, anxiety, abnormal bruising/bleeding, major joint swelling.     PMH Lab Review   POTASSIUM  Date Value Ref Range Status  10/10/2015 4.6 3.5 - 5.1 mmol/L Final   SODIUM  Date Value Ref Range Status  10/10/2015 138 135 - 145 mmol/L Final   CREAT  Date Value Ref Range Status  04/28/2013 1.09 0.50 - 1.35 mg/dL Final   CREATININE, SER  Date Value Ref Range Status  10/10/2015 0.99 0.61 - 1.24 mg/dL Final        Chief Complaint noted Review of Symptoms - see HPI PMH - Smoking status noted.   Vital Signs reviewed    Review of Systems     Objective:   Physical Exam  Alert nad Obese Skin - 2 lesions on back L upper 0.7 oval rough irregular and R lower 1 cm rough oval irregular Heart - Regular rate and rhythm.  No murmurs, gallops or rubs.    Lungs:  Normal respiratory effort, chest expands symmetrically. Lungs are clear to auscultation, no crackles or wheezes. Abdomen: soft and non-tender without masses,  organomegaly or hernias noted.  No guarding or rebound Extremities:  No cyanosis, edema, or deformity noted with good range of motion of all major joints.   Neck:  No deformities, thyromegaly, masses, or tenderness noted.   Supple with full range of motion without pain.       Assessment & Plan:   HM - discussed PSA he would like.  See after visit summary

## 2016-03-14 NOTE — Assessment & Plan Note (Signed)
His major issue.  See after visit summary

## 2016-03-14 NOTE — Patient Instructions (Addendum)
Good to see you today!  Thanks for coming in.  You need a colonoscopy - Call and make an appointment  I will call you if your tests are not good.  Otherwise I will send you a letter.  If you do not hear from me with in 2 weeks please call our office.     Monitor your blood pressure it should be < 140/90  Your biggest health issue is your weight  Only diet drinks  Cut back on all portions  Weight yourself once a week - aim to lose 1-2 lbs a week  Come back in 3-6 months

## 2016-03-14 NOTE — Assessment & Plan Note (Signed)
BP Readings from Last 3 Encounters:  03/14/16 142/90  10/19/15 138/83  10/10/15 162/89   Slightly high today but given that he will be working on weight and diet will monitor and not make medication changes

## 2016-03-14 NOTE — Assessment & Plan Note (Signed)
Well controlled uses rescue inhaler perhaps monthly at most

## 2016-03-15 ENCOUNTER — Encounter: Payer: Self-pay | Admitting: Family Medicine

## 2016-03-15 LAB — HEPATITIS C ANTIBODY: HCV AB: NEGATIVE

## 2016-03-15 LAB — PSA: PSA: 0.42 ng/mL (ref <=4.00)

## 2016-04-13 ENCOUNTER — Encounter: Payer: Self-pay | Admitting: Internal Medicine

## 2016-05-09 ENCOUNTER — Other Ambulatory Visit: Payer: Self-pay

## 2016-05-09 DIAGNOSIS — R002 Palpitations: Secondary | ICD-10-CM

## 2016-05-09 DIAGNOSIS — I10 Essential (primary) hypertension: Secondary | ICD-10-CM

## 2016-05-09 MED ORDER — METOPROLOL SUCCINATE ER 50 MG PO TB24: 50.0000 mg | ORAL_TABLET | Freq: Every day | ORAL | Status: DC

## 2016-06-30 ENCOUNTER — Other Ambulatory Visit: Payer: Self-pay | Admitting: Family Medicine

## 2016-11-08 ENCOUNTER — Encounter: Payer: Self-pay | Admitting: Family Medicine

## 2016-11-08 ENCOUNTER — Ambulatory Visit (INDEPENDENT_AMBULATORY_CARE_PROVIDER_SITE_OTHER): Payer: Commercial Managed Care - HMO | Admitting: Family Medicine

## 2016-11-08 VITALS — BP 128/80 | HR 93 | Temp 98.3°F | Wt 290.0 lb

## 2016-11-08 DIAGNOSIS — R002 Palpitations: Secondary | ICD-10-CM

## 2016-11-08 NOTE — Patient Instructions (Signed)
It was great seeing you today! We have addressed the following issues today  1. Your cardiac exam was normal today and blood pressure is well controlled. Episode of tachycardia and leg weakness was most likely due to the fact that you missed your blood pressure medications. Please make sure you take your medications every day. 2. As we discussed, exercise can also help with hypertension. I would recommend restarting some sort of routine exercise 3. Follow up with your PCP if symptoms return.    If we did any lab work today, and the results require attention, either me or my nurse will get in touch with you. If everything is normal, you will get a letter in mail. If you don't hear from us in two weeks, please give us a call. Otherwise, we look forward to seeing you again at your next visit. If you have any questions or concerns before then, please call the clinic at [phone].   Please bring all your medications to every doctors visit   Sign up for My Chart to have easy access to your labs results, and communication with your Primary care physician.     Please check-out at the front desk before leaving the clinic.    Take Care,

## 2016-11-08 NOTE — Progress Notes (Signed)
   Subjective:    Patient ID: Jeffery Nielsen, male    DOB: March 25, 1966, 50 y.o.   MRN: [id number]   CC: Heart palpitations  HPI: Jeffery Dessertodd Redd  is a 50 yo male with a past medical history significant HTN, asthma, history of heart palpitations who presents today for one episode of heart palpitation.   Heart palpitations:  Patient reports that last week, he was woken up in the middle of the night with heart palpitations accompanied with some weakness in his legs. Patient states that episode resolved the next morning when he took his blood pressure medications. Patient admitted to not taking BP medications for two days prior to having palpitation episode. Patient has had a history of palpitations in the past and is currently on metoprolol in addition to HCTZ and enalapril. Patient denies headaches, tinnitus, dizziness, vision trouble, shortness of breath or chest pain.  Smoking status reviewed   ROS: all other systems were reviewed and are negative other than in the HPI   Past medical history, surgical, family, and social history reviewed and updated in the EMR as appropriate.  Objective:  BP 128/80   Pulse 93   Temp 98.3 F (36.8 C) (Oral)   Wt 290 lb (131.5 kg)   SpO2 99%   BMI 40.45 kg/m   Vitals and nursing note reviewed  General: NAD, pleasant, able to participate in exam Cardiac: RRR, normal heart sounds, no murmurs. 2+ radial and PT pulses bilaterally Respiratory: CTAB, normal effort, No wheezes, rales or rhonchi Abdomen: soft, nontender, nondistended, no hepatic or splenomegaly, +BS Extremities: no edema or cyanosis. WWP. Skin: warm and dry, no rashes noted Neuro: alert and oriented x4, no focal deficits Psych: Normal affect and mood   Assessment & Plan:   #Palpitations, acute, resolved Patient with one episode of heart palpitation most likely secondary to medications non adherence. Patient had cardiac event monitor in 2014 and ECHO with normal EF in 2014. I also reviewed EKG  from 2015 and 2016 which all showed NSR. Clinically stable and much improved do not think patient will need repeat EKG today. --Continue current home regimen, enalapril, HCTZ, metoprolol --Start exercise program, patient is motivated to lose weight.    Lovena NeighboursAbdoulaye Edrei Norgaard, MD Family Medicine Resident PGY-1

## 2016-12-03 ENCOUNTER — Other Ambulatory Visit: Payer: Self-pay | Admitting: Family Medicine

## 2017-01-11 ENCOUNTER — Telehealth: Payer: Self-pay | Admitting: Family Medicine

## 2017-01-11 NOTE — Telephone Encounter (Signed)
Left practice number so pt can schedule follow-up with PCP - Jeffery Nielsen

## 2017-03-27 ENCOUNTER — Ambulatory Visit (INDEPENDENT_AMBULATORY_CARE_PROVIDER_SITE_OTHER): Payer: Commercial Managed Care - HMO | Admitting: Family Medicine

## 2017-03-27 ENCOUNTER — Encounter: Payer: Self-pay | Admitting: Family Medicine

## 2017-03-27 DIAGNOSIS — I10 Essential (primary) hypertension: Secondary | ICD-10-CM

## 2017-03-27 DIAGNOSIS — Z13220 Encounter for screening for lipoid disorders: Secondary | ICD-10-CM

## 2017-03-27 DIAGNOSIS — Z125 Encounter for screening for malignant neoplasm of prostate: Secondary | ICD-10-CM

## 2017-03-27 MED ORDER — ALBUTEROL SULFATE HFA 108 (90 BASE) MCG/ACT IN AERS: INHALATION_SPRAY | RESPIRATORY_TRACT | 2 refills | Status: DC

## 2017-03-27 NOTE — Patient Instructions (Signed)
Good to see you today!  Thanks for coming in.  I am glad you are getting your colonoscopy  I will call you if your tests are not good.  Otherwise I will send you a letter.  If you do not hear from me with in 2 weeks please call our office.     Your most important health issue is your weight.  Try to find a diet regimen that you can do for a long time and that you lose about 2 lbs a week.   We have a dietician or counselor if you would like to meet with her   Come back in 6 months

## 2017-03-27 NOTE — Assessment & Plan Note (Signed)
Unchanged - see after visit summary  

## 2017-03-27 NOTE — Progress Notes (Signed)
Subjective  Patient is presenting with the following illnesses  Physical exam  Feels well except is a bit discouraged about his weight  Patient reports no  vision/ hearing changes,anorexia, weight change, fever ,adenopathy, persistant / recurrent hoarseness, swallowing issues, chest pain, edema,persistant / recurrent cough, hemoptysis, dyspnea(rest, exertional, paroxysmal nocturnal), gastrointestinal  bleeding (melena, , abdominal pain, excessive heart burn, GU symptoms(dysuria, hematuria, pyuria, voiding/incontinence  Issues) syncope, focal weakness, severe memory loss, concerning skin lesions, depression, anxiety, abnormal bruising/bleeding, major joint swelling.    HYPERTENSION Disease Monitoring  Home BP Monitoring (Severity) not checking Symptoms - Chest pain- no    Dyspnea- mild thinks is due to wt Medications (Modifying factors) Compliance-  daily. Lightheadedness-  no  Edema- no Timing - continuous  Duration - years ROS - See HPI  PMH Lab Review   Potassium  Date Value Ref Range Status  10/10/2015 4.6 3.5 - 5.1 mmol/L Final   Sodium  Date Value Ref Range Status  10/10/2015 138 135 - 145 mmol/L Final   Creat  Date Value Ref Range Status  04/28/2013 1.09 0.50 - 1.35 mg/dL Final   Creatinine, Ser  Date Value Ref Range Status  10/10/2015 0.99 0.61 - 1.24 mg/dL Final          Chief Complaint noted Review of Symptoms - see HPI PMH - Smoking status noted.     Objective Vital Signs reviewed Ears:  External ear exam shows no significant lesions or deformities.  Otoscopic examination reveals clear canals, tympanic membranes are intact bilaterally without bulging, retraction, inflammation or discharge. Hearing is grossly normal bilaterall Neck:  No deformities, thyromegaly, masses, or tenderness noted.   Supple with full range of motion without pain. Mouth - no lesions, mucous membranes are moist, no decaying teeth   Lungs:  Normal respiratory effort, chest expands  symmetrically. Lungs are clear to auscultation, no crackles or wheezes. Heart - Regular rate and rhythm.  No murmurs, gallops or rubs.    Abdomen: Obese soft and non-tender without masses, organomegaly or hernias noted.  No guarding or rebound Extremities:  No cyanosis, edema, or deformity noted with good range of motion of all major joints.   Skin:  Intact without suspicious lesions or rashes Has chronic appearing well circumscribed dark lesions on right lower and left upper back consistent with benign moles     Assessments/Plans  No problem-specific Assessment & Plan notes found for this encounter.   See Encounter view if individual problem A/Ps not visible See after visit summary for details of patient instuctions

## 2017-03-27 NOTE — Assessment & Plan Note (Signed)
At gaol check labs

## 2017-03-28 ENCOUNTER — Telehealth: Payer: Self-pay | Admitting: Family Medicine

## 2017-03-28 ENCOUNTER — Encounter: Payer: Self-pay | Admitting: Family Medicine

## 2017-03-28 LAB — COMPREHENSIVE METABOLIC PANEL
ALBUMIN: 4.2 g/dL (ref 3.5–5.5)
ALK PHOS: 72 IU/L (ref 39–117)
ALT: 18 IU/L (ref 0–44)
AST: 17 IU/L (ref 0–40)
Albumin/Globulin Ratio: 1.4 (ref 1.2–2.2)
BUN / CREAT RATIO: 12 (ref 9–20)
BUN: 12 mg/dL (ref 6–24)
Bilirubin Total: 0.6 mg/dL (ref 0.0–1.2)
CO2: 26 mmol/L (ref 18–29)
CREATININE: 1.04 mg/dL (ref 0.76–1.27)
Calcium: 9.3 mg/dL (ref 8.7–10.2)
Chloride: 100 mmol/L (ref 96–106)
GFR calc non Af Amer: 82 mL/min/{1.73_m2} (ref >59)
GFR, EST AFRICAN AMERICAN: 95 mL/min/{1.73_m2} (ref >59)
GLOBULIN, TOTAL: 3 g/dL (ref 1.5–4.5)
Glucose: 92 mg/dL (ref 65–99)
Potassium: 4.5 mmol/L (ref 3.5–5.2)
SODIUM: 140 mmol/L (ref 134–144)
Total Protein: 7.2 g/dL (ref 6.0–8.5)

## 2017-03-28 LAB — LIPID PANEL
CHOL/HDL RATIO: 3.2 ratio (ref 0.0–5.0)
Cholesterol, Total: 137 mg/dL (ref 100–199)
HDL: 43 mg/dL (ref >39)
LDL CALC: 73 mg/dL (ref 0–99)
Triglycerides: 107 mg/dL (ref 0–149)
VLDL CHOLESTEROL CAL: 21 mg/dL (ref 5–40)

## 2017-03-28 LAB — PSA: PROSTATE SPECIFIC AG, SERUM: 0.3 ng/mL (ref 0.0–4.0)

## 2017-03-28 MED ORDER — SILDENAFIL CITRATE 100 MG PO TABS: 50.0000 mg | ORAL_TABLET | Freq: Every day | ORAL | 11 refills | Status: DC | PRN

## 2017-03-28 NOTE — Telephone Encounter (Signed)
Pt would like a refill on sildenafil 20 mg. Pt uses [facility] [address] ep

## 2017-03-28 NOTE — Telephone Encounter (Signed)
Patient calling to request refill of:  Name of Medication(s):  sildenafil 20 mg Last date of OV:  03/27/2017 [facility] [address]  Will forward to MD. Burnard HawthorneJazmin Hartsell,CMA

## 2017-04-10 ENCOUNTER — Telehealth: Payer: Self-pay | Admitting: Family Medicine

## 2017-04-10 NOTE — Telephone Encounter (Signed)
Called mobile without successful pt contact and left message requesting date of colonoscopy and if he's made any dietary/lifestyle changes. Asked he call back to update us. - Mesha Guinyard

## 2017-04-16 ENCOUNTER — Encounter: Payer: Self-pay | Admitting: Internal Medicine

## 2017-04-19 ENCOUNTER — Telehealth: Payer: Self-pay | Admitting: Family Medicine

## 2017-04-19 NOTE — Telephone Encounter (Signed)
Pt made an appt for colonoscopy July 2 with Dolores.

## 2017-05-13 ENCOUNTER — Ambulatory Visit (AMBULATORY_SURGERY_CENTER): Payer: Self-pay

## 2017-05-13 ENCOUNTER — Encounter: Payer: Self-pay | Admitting: Internal Medicine

## 2017-05-13 VITALS — Ht 71.0 in | Wt 292.2 lb

## 2017-05-13 DIAGNOSIS — Z1211 Encounter for screening for malignant neoplasm of colon: Secondary | ICD-10-CM

## 2017-05-13 NOTE — Progress Notes (Signed)
Denies allergies to eggs or soy products. Denies complication of anesthesia or sedation. Denies use of weight loss medication. Denies use of O2.   Emmi instructions given for colonoscopy.  

## 2017-05-24 ENCOUNTER — Encounter: Payer: Self-pay | Admitting: Student

## 2017-05-24 ENCOUNTER — Ambulatory Visit (INDEPENDENT_AMBULATORY_CARE_PROVIDER_SITE_OTHER): Payer: 59 | Admitting: Student

## 2017-05-24 DIAGNOSIS — L02222 Furuncle of back [any part, except buttock]: Secondary | ICD-10-CM

## 2017-05-24 DIAGNOSIS — L02232 Carbuncle of back [any part, except buttock]: Secondary | ICD-10-CM

## 2017-05-24 NOTE — Progress Notes (Signed)
  Subjective:    Jeffery Nielsen is a 51 y.o. old male here for boils.   HPI Boils: started about a week ago. Boils on his upper back on the left side. Feels sore. Pain is about 8/10. Pain is constant without radiation. Tried topical over-the-counter medication. He says his pain improved. Not sure if it has drained or not. Denies fever but says he broke up into sweat about 2 nights ago. Has history of boils in the past that was managed with I&D. No history of diabetes or immunodeficiency.   PMH/Problem List: has OBESITY; HYPERTENSION, BENIGN SYSTEMIC; Asthma; and Boil, back on his problem list.   has a past medical history of Allergy; Asthma; Blood transfusion without reported diagnosis; and Hypertension.  FH:  Family History  Problem Relation Age of Onset  . Coronary artery disease Father        died in 2360s  . Heart disease Father   . Asthma Mother   . Heart disease Mother   . Colon cancer Neg Hx   . Esophageal cancer Neg Hx   . Rectal cancer Neg Hx   . Stomach cancer Neg Hx     SH Social History  Substance Use Topics  . Smoking status: Never Smoker  . Smokeless tobacco: Never Used  . Alcohol use 0.5 oz/week    1 drink(s) per week     Comment: Occasional    Review of Systems Review of systems negative except for pertinent positives and negatives in history of present illness above.     Objective:     Vitals:   05/24/17 0941  BP: 140/90  Pulse: 87  Temp: 98.1 F (36.7 C)  TempSrc: Oral  SpO2: 97%  Weight: 290 lb 6.4 oz (131.7 kg)  Height: 5\' 11"  (1.803 m)    Physical Exam GEN: appears well, no apparent distress. HEM: negative for cervical or periauricular lymphadenopathies CVS: RRR, nl S1&S2, no murmurs, no edema RESP: speaks in full sentence, no IWOB MSK: no focal tenderness or notable swelling SKIN: an elliptoid area of skin erythema about an inch long and half an inch wide with central scabbing. No fluid loculation or tenderness with palpation. No increased warmth  to touch.    NEURO: alert and oiented appropriately, no gross defecits  PSYCH: euthymic mood with congruent affect    Assessment and Plan:  Boil, back History and exam suggestive for healing skin abscess/folliculitis. It seems it has already drained. Exam without underlying fluid loculation or tenderness. No history of diabetes or known immunodeficiency. Recommended conservative management including warm compress and Tylenol as needed for pain.  Follow-up as needed  Return if symptoms worsen or fail to improve.  Almon Herculesaye T Gonfa, MD 05/24/17 Pager: [phone]

## 2017-05-24 NOTE — Patient Instructions (Signed)
It was great seeing you today! We have addressed the following issues today 1. Boils: It seems the boil has already drained and started improving. I recommend warm compress as we discussed in the office. I also recommend Tylenol as needed for pain. You can come back and see us if you have worsening of the boil, fever or other symptoms concerning to you.   If we did any lab work today, and the results require attention, either me or my nurse will get in touch with you. If everything is normal, you will get a letter in mail and a message via . If you don't hear from us in two weeks, please give us a call. Otherwise, we look forward to seeing you again at your next visit. If you have any questions or concerns before then, please call the clinic at [phone].  Please bring all your medications to every doctors visit  Sign up for My Chart to have easy access to your labs results, and communication with your Primary care physician.    Please check-out at the front desk before leaving the clinic.    Take Care,   Dr. Alanda SlimGonfa

## 2017-05-24 NOTE — Assessment & Plan Note (Signed)
History and exam suggestive for healing skin abscess/folliculitis. It seems it has already drained. Exam without underlying fluid loculation or tenderness. No history of diabetes or known immunodeficiency. Recommended conservative management including warm compress and Tylenol as needed for pain.  Follow-up as needed

## 2017-05-27 ENCOUNTER — Encounter: Payer: Self-pay | Admitting: Internal Medicine

## 2017-05-27 ENCOUNTER — Ambulatory Visit (AMBULATORY_SURGERY_CENTER): Payer: 59 | Admitting: Internal Medicine

## 2017-05-27 VITALS — BP 129/90 | HR 78 | Temp 98.6°F | Resp 11 | Ht 71.0 in | Wt 292.0 lb

## 2017-05-27 DIAGNOSIS — Z1212 Encounter for screening for malignant neoplasm of rectum: Secondary | ICD-10-CM | POA: Diagnosis not present

## 2017-05-27 DIAGNOSIS — Z1211 Encounter for screening for malignant neoplasm of colon: Secondary | ICD-10-CM

## 2017-05-27 MED ORDER — SODIUM CHLORIDE 0.9 % IV SOLN: 500.0000 mL | INTRAVENOUS | Status: DC

## 2017-05-27 NOTE — Progress Notes (Signed)
Report given to PACU, vss 

## 2017-05-27 NOTE — Op Note (Signed)
[facility] Patient Name: Jeffery Nielsen Procedure Date: 05/27/2017 1:27 PM MRN: [id number] Endoscopist: Iva Boop , MD Age: 51 Referring MD:  Date of Birth: [birth year] Gender: Male Account #: [id number] Procedure:                Colonoscopy Indications:              Screening for colorectal malignant neoplasm Medicines:                Propofol per Anesthesia, Monitored Anesthesia Care Procedure:                Pre-Anesthesia Assessment:                           - Prior to the procedure, a History and Physical                            was performed, and patient medications and                            allergies were reviewed. The patient's tolerance of                            previous anesthesia was also reviewed. The risks                            and benefits of the procedure and the sedation                            options and risks were discussed with the patient.                            All questions were answered, and informed consent                            was obtained. Prior Anticoagulants: The patient has                            taken no previous anticoagulant or antiplatelet                            agents. ASA Grade Assessment: II - A patient with                            mild systemic disease. After reviewing the risks                            and benefits, the patient was deemed in                            satisfactory condition to undergo the procedure.                           After obtaining informed consent, the colonoscope  was passed under direct vision. Throughout the                            procedure, the patient's blood pressure, pulse, and                            oxygen saturations were monitored continuously. The                            Model CF-HQ190L [id number]) scope was introduced                            through the anus and advanced to the the cecum,                             identified by appendiceal orifice and ileocecal                            valve. The quality of the bowel preparation was                            excellent. The colonoscopy was performed without                            difficulty. The patient tolerated the procedure                            well. The bowel preparation used was Miralax. The                            ileocecal valve, appendiceal orifice, and rectum                            were photographed. Scope In: 1:38:24 PM Scope Out: 1:51:00 PM Scope Withdrawal Time: 0 hours 10 minutes 29 seconds  Total Procedure Duration: 0 hours 12 minutes 36 seconds  Findings:                 The perianal and digital rectal examinations were                            normal. Pertinent negatives include normal prostate                            (size, shape, and consistency).                           The colon (entire examined portion) appeared normal.                           No additional abnormalities were found on                            retroflexion. Complications:            No immediate complications. Estimated  blood loss:                            None. Estimated Blood Loss:     Estimated blood loss: none. Recommendation:           - Repeat colonoscopy in 10 years for screening                            purposes.                           - Patient has a contact number available for                            emergencies. The signs and symptoms of potential                            delayed complications were discussed with the                            patient. Return to normal activities tomorrow.                            Written discharge instructions were provided to the                            patient.                           - Resume previous diet.                           - Continue present medications. [name], MD 05/27/2017 2:00:02 PM This report has been signed electronically.

## 2017-05-27 NOTE — Patient Instructions (Addendum)
    No polyps or cancer seen.  Next routine colonoscopy or other screening test in 10 years - 2028  I appreciate the opportunity to care for you. Iva Booparl E. Dmiyah Liscano, MD, [facility]  Discharge instructions given. Normal exam. Resume previous medications. YOU HAD AN ENDOSCOPIC PROCEDURE TODAY AT THE [facility]:   Refer to the procedure report that was given to you for any specific questions about what was found during the examination.  If the procedure report does not answer your questions, please call your gastroenterologist to clarify.  If you requested that your care partner not be given the details of your procedure findings, then the procedure report has been included in a sealed envelope for you to review at your convenience later.  YOU SHOULD EXPECT: Some feelings of bloating in the abdomen. Passage of more gas than usual.  Walking can help get rid of the air that was put into your GI tract during the procedure and reduce the bloating. If you had a lower endoscopy (such as a colonoscopy or flexible sigmoidoscopy) you may notice spotting of blood in your stool or on the toilet paper. If you underwent a bowel prep for your procedure, you may not have a normal bowel movement for a few days.  Please Note:  You might notice some irritation and congestion in your nose or some drainage.  This is from the oxygen used during your procedure.  There is no need for concern and it should clear up in a day or so.  SYMPTOMS TO REPORT IMMEDIATELY:   Following lower endoscopy (colonoscopy or flexible sigmoidoscopy):  Excessive amounts of blood in the stool  Significant tenderness or worsening of abdominal pains  Swelling of the abdomen that is new, acute  Fever of 100F or higher   For urgent or emergent issues, a gastroenterologist can be reached at any hour by calling [phone].   DIET:  We do recommend a small meal at first, but then you may proceed to your regular diet.  Drink  plenty of fluids but you should avoid alcoholic beverages for 24 hours.  ACTIVITY:  You should plan to take it easy for the rest of today and you should NOT DRIVE or use heavy machinery until tomorrow (because of the sedation medicines used during the test).    FOLLOW UP: Our staff will call the number listed on your records the next business day following your procedure to check on you and address any questions or concerns that you may have regarding the information given to you following your procedure. If we do not reach you, we will leave a message.  However, if you are feeling well and you are not experiencing any problems, there is no need to return our call.  We will assume that you have returned to your regular daily activities without incident.  If any biopsies were taken you will be contacted by phone or by letter within the next 1-3 weeks.  Please call us at [phone] if you have not heard about the biopsies in 3 weeks.    SIGNATURES/CONFIDENTIALITY: You and/or your care partner have signed paperwork which will be entered into your electronic medical record.  These signatures attest to the fact that that the information above on your After Visit Summary has been reviewed and is understood.  Full responsibility of the confidentiality of this discharge information lies with you and/or your care-partner.

## 2017-05-27 NOTE — Progress Notes (Signed)
Pt's states no medical or surgical changes since previsit or office visit. 

## 2017-05-28 ENCOUNTER — Telehealth: Payer: Self-pay | Admitting: *Deleted

## 2017-05-28 NOTE — Telephone Encounter (Signed)
  Follow up Call-  Call back number 05/27/2017  Post procedure Call Back phone  # [phone]  Permission to leave phone message Yes  Some recent data might be hidden     Patient questions:  Do you have a fever, pain , or abdominal swelling? No. Pain Score  0 *  Have you tolerated food without any problems? Yes.    Have you been able to return to your normal activities? Yes.    Do you have any questions about your discharge instructions: Diet   No. Medications  No. Follow up visit  No.  Do you have questions or concerns about your Care? No.  Actions: * If pain score is 4 or above: No action needed, pain <4.

## 2017-05-28 NOTE — Telephone Encounter (Signed)
No answer, left message to call if questions or concerns. 

## 2017-06-08 ENCOUNTER — Other Ambulatory Visit: Payer: Self-pay | Admitting: Cardiology

## 2017-06-08 DIAGNOSIS — R002 Palpitations: Secondary | ICD-10-CM

## 2017-06-08 DIAGNOSIS — I10 Essential (primary) hypertension: Secondary | ICD-10-CM

## 2017-07-17 ENCOUNTER — Telehealth: Payer: Self-pay | Admitting: Family Medicine

## 2017-07-17 DIAGNOSIS — R002 Palpitations: Secondary | ICD-10-CM

## 2017-07-17 NOTE — Telephone Encounter (Signed)
Pt called and needs to have a new referral to see his cardiologist Dr. Jens Som. Please let him know when this is done so that he can make an appointment. jw

## 2017-07-18 NOTE — Telephone Encounter (Signed)
Please let him know  Our records indicate he as last seen by cardiology in 2015 without any scheduled follow up  We need to know the reason in order to refer him  Thanks  [facility]

## 2017-07-23 NOTE — Telephone Encounter (Signed)
Patient states he was seen for palpitations and was started on medication, states cardiology told him to return if he started having palpitations again and to follow up on medication.

## 2017-07-23 NOTE — Telephone Encounter (Signed)
Please let him know I put in a referral to Dr Jens Som  Thanks

## 2017-08-09 ENCOUNTER — Ambulatory Visit (INDEPENDENT_AMBULATORY_CARE_PROVIDER_SITE_OTHER): Payer: 59 | Admitting: Physician Assistant

## 2017-08-09 ENCOUNTER — Encounter: Payer: Self-pay | Admitting: Physician Assistant

## 2017-08-09 VITALS — BP 144/91 | HR 72 | Ht 71.0 in | Wt 293.4 lb

## 2017-08-09 DIAGNOSIS — R079 Chest pain, unspecified: Secondary | ICD-10-CM | POA: Diagnosis not present

## 2017-08-09 DIAGNOSIS — R002 Palpitations: Secondary | ICD-10-CM

## 2017-08-09 MED ORDER — METOPROLOL SUCCINATE ER 25 MG PO TB24: 25.0000 mg | ORAL_TABLET | Freq: Every day | ORAL | 5 refills | Status: DC
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
## 2017-08-09 NOTE — Patient Instructions (Signed)
Medication Instructions:   ADD Metoprolol Succinate  DAILY to your current medications. Take this medication in the evening.   (You will still take the metoprolol succinate  DAILY in the morning. Additionally, we are not changing doses of any other medications.)  Labwork:   TSH today   Testing/Procedures:  Your physician has requested that you have an exercise tolerance test. For further information please visit [email or web address] Please also follow instruction sheet, as given.   Follow-Up:  With Dr. Jens Som in 3 months  If you need a refill on your cardiac medications before your next appointment, please call your pharmacy.

## 2017-08-10 ENCOUNTER — Encounter: Payer: Self-pay | Admitting: Physician Assistant

## 2017-08-10 LAB — TSH: TSH: 0.8 u[IU]/mL (ref 0.450–4.500)

## 2017-08-12 NOTE — Progress Notes (Signed)
Thyroid normal

## 2017-08-13 ENCOUNTER — Telehealth: Payer: Self-pay | Admitting: Cardiology

## 2017-08-13 NOTE — Telephone Encounter (Signed)
Returned the call to the patient with his results. He verbalized his understanding:  Notes recorded by Azalee Course, PA on 08/12/2017 at 4:07 PM EDT Thyroid normal

## 2017-08-13 NOTE — Telephone Encounter (Signed)
Jeffery Nielsen is calling about his lab results . Please call

## 2017-08-16 ENCOUNTER — Telehealth (HOSPITAL_COMMUNITY): Payer: Self-pay

## 2017-08-16 NOTE — Telephone Encounter (Signed)
Encounter complete. 

## 2017-08-20 ENCOUNTER — Ambulatory Visit (HOSPITAL_COMMUNITY)
Admission: RE | Admit: 2017-08-20 | Payer: 59 | Source: Ambulatory Visit | Attending: Physician Assistant | Admitting: Physician Assistant

## 2017-08-27 ENCOUNTER — Telehealth (HOSPITAL_COMMUNITY): Payer: Self-pay

## 2017-08-27 NOTE — Telephone Encounter (Signed)
Encounter complete. 

## 2017-08-28 ENCOUNTER — Telehealth (HOSPITAL_COMMUNITY): Payer: Self-pay

## 2017-08-28 NOTE — Telephone Encounter (Signed)
Encounter complete. 

## 2017-08-30 ENCOUNTER — Ambulatory Visit (HOSPITAL_COMMUNITY)
Admission: RE | Admit: 2017-08-30 | Discharge: 2017-08-30 | Disposition: A | Discharge status: Home / Self Care | Payer: 59 | Source: Ambulatory Visit | Attending: Cardiology | Admitting: Cardiology

## 2017-08-30 DIAGNOSIS — R079 Chest pain, unspecified: Secondary | ICD-10-CM | POA: Diagnosis not present

## 2017-08-30 DIAGNOSIS — R9439 Abnormal result of other cardiovascular function study: Secondary | ICD-10-CM | POA: Diagnosis not present

## 2017-09-02 LAB — EXERCISE TOLERANCE TEST
CHL CUP MPHR: 168 {beats}/min
CHL CUP RESTING HR STRESS: 88 {beats}/min
CSEPPHR: 171 {beats}/min
Estimated workload: 10.4 METS
Exercise duration (min): 8 min
Exercise duration (sec): 23 s
Percent HR: 101 %
RPE: 17

## 2017-09-03 NOTE — Progress Notes (Signed)
Stress test somewhat abnormal, Dr. Jens Som recommended treadmill stress echo to further evaluated. I tried to contact patient, but he did not pick up the phone. Will try again later.

## 2017-09-04 NOTE — Progress Notes (Signed)
Discussed with patient regarding the result, he will expect a call from [place] to arrange treadmill myoview.

## 2017-09-05 ENCOUNTER — Telehealth (HOSPITAL_COMMUNITY): Payer: Self-pay | Admitting: Physician Assistant

## 2017-09-05 ENCOUNTER — Telehealth: Payer: Self-pay | Admitting: *Deleted

## 2017-09-05 DIAGNOSIS — R9430 Abnormal result of cardiovascular function study, unspecified: Secondary | ICD-10-CM

## 2017-09-05 DIAGNOSIS — R9431 Abnormal electrocardiogram [ECG] [EKG]: Secondary | ICD-10-CM

## 2017-09-05 NOTE — Telephone Encounter (Signed)
I called pt and spoke with him about getting him scheduled for Exercise myoview , I offered him 10/18 and 10/19 and he stated that he needs to call me back when he gets his schedule.

## 2017-09-05 NOTE — Telephone Encounter (Signed)
Jeffery Nielsen notes he did discuss w patient, abnormal ETT, treadmill myoview ordered, pt aware we will call to set this up. I have sent request to scheduling to arrange.

## 2017-09-05 NOTE — Telephone Encounter (Signed)
-----   Message from [place], [place] sent at 09/03/2017  5:36 PM EDT ----- Stress test somewhat abnormal, Dr. Jens Som recommended treadmill stress echo to further evaluated. I tried to contact patient, but he did not pick up the phone. Will try again later.

## 2017-10-01 ENCOUNTER — Other Ambulatory Visit: Payer: Self-pay | Admitting: Family Medicine

## 2017-10-23 ENCOUNTER — Telehealth (HOSPITAL_COMMUNITY): Payer: Self-pay | Admitting: Physician Assistant

## 2017-10-23 NOTE — Telephone Encounter (Signed)
09/05/17 @ 11:55am lm for patient to call kf  Patient will need 2 day study 09/10/17 Called pt and lmsg for him to CB to sch myoview..RG 09/19/17 Called pt and lmsg for him to CB to schedule myoview..RG 10/23/17 Called pt and lmsg for him to CB to get scheduled for [email or web address]  User: Trina AoGriffin, Thomasenia Dowse A Date/time: 10/23/2017 10:10 AM  Comment: Called pt and lmsg for him to CB to get scheduled for [email or web address].RG  Context: [place] Schedule Orders/Appt Requests Outcome: Left Message  Phone number: [phone] Phone Type: Home Phone  Comm. type: Telephone Call type: Outgoing  Contact: Blanchie DessertGibbs, Garv Relation to patient: Self  Letter:      User: Trina AoGriffin, Siarah Deleo A Date/time: 09/19/2017 10:43 AM  Comment: Called pt and lmsg for him to CB to schedule [email or web address]  Context: [place] Schedule Orders/Appt Requests Outcome: Left Message  Phone number: [phone] Phone Type: Home Phone  Comm. type: Telephone Call type: Outgoing  Contact: Blanchie DessertGibbs, Rollyn Relation to patient: Self  Letter:      User: Trina AoGriffin, Bernette Seeman A Date/time: 09/10/2017 3:36 PM  Comment: Called pt and lmsg for him to CB to get scheduled for myoview.   Context: [place] Schedule Orders/Appt Requests Outcome: Left Message  Phone number: [phone] Phone Type: Home Phone  Comm. type: Telephone Call type: Outgoing  Contact: Blanchie DessertGibbs, Kamareon Relation to patient: Self  Letter:
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
## 2017-11-21 ENCOUNTER — Ambulatory Visit: Payer: 59 | Admitting: Cardiology

## 2017-11-23 ENCOUNTER — Other Ambulatory Visit: Payer: Self-pay | Admitting: Family Medicine

## 2018-01-09 ENCOUNTER — Telehealth: Payer: Self-pay | Admitting: Cardiology

## 2018-01-09 DIAGNOSIS — R002 Palpitations: Secondary | ICD-10-CM

## 2018-01-09 NOTE — Telephone Encounter (Signed)
New message   Patient c/o Palpitations:  High priority if patient c/o lightheadedness, shortness of breath, or chest pain  1) How long have you had palpitations/irregular HR/ Afib? Are you having the symptoms now? 1 DAY  Are you currently experiencing lightheadedness, SOB or CP? SOB, "light chest pain" 2) Do you have a history of afib (atrial fibrillation) or irregular heart rhythm? YES  3) Have you checked your BP or HR? (document readings if available): NO  Are you experiencing any other symptoms? Lightheaded

## 2018-01-09 NOTE — Telephone Encounter (Signed)
Spoke with pt, last night while lying down watching TV he suddenly lost his breath, heart started racing and he had some tightness in his chest. He took the metoprolol and an aspirin, after about one hour all the symptoms went away. This morning he is feeling a little lightheaded and has some SOB walking to the bathroom. His chest is sore. He has not checked his bp. He has not had the nuclear stress test or the monitor. Will forward to hao meng pa to review and advise.

## 2018-01-10 NOTE — Telephone Encounter (Signed)
He needs a 30 day monitor

## 2018-01-13 NOTE — Telephone Encounter (Signed)
Left message for pt to call.

## 2018-01-16 NOTE — Telephone Encounter (Signed)
Spoke with pt, event monitor scheduled.

## 2018-01-27 ENCOUNTER — Ambulatory Visit (INDEPENDENT_AMBULATORY_CARE_PROVIDER_SITE_OTHER): Payer: 59

## 2018-01-27 DIAGNOSIS — R002 Palpitations: Secondary | ICD-10-CM

## 2018-02-17 ENCOUNTER — Telehealth: Payer: Self-pay | Admitting: Cardiology

## 2018-02-17 ENCOUNTER — Emergency Department (HOSPITAL_COMMUNITY): Payer: 59

## 2018-02-17 ENCOUNTER — Encounter (HOSPITAL_COMMUNITY): Payer: Self-pay | Admitting: Emergency Medicine

## 2018-02-17 ENCOUNTER — Other Ambulatory Visit: Payer: Self-pay

## 2018-02-17 DIAGNOSIS — Z79899 Other long term (current) drug therapy: Secondary | ICD-10-CM | POA: Diagnosis not present

## 2018-02-17 DIAGNOSIS — I10 Essential (primary) hypertension: Secondary | ICD-10-CM | POA: Diagnosis not present

## 2018-02-17 DIAGNOSIS — I4891 Unspecified atrial fibrillation: Secondary | ICD-10-CM | POA: Diagnosis not present

## 2018-02-17 DIAGNOSIS — R002 Palpitations: Secondary | ICD-10-CM | POA: Diagnosis not present

## 2018-02-17 DIAGNOSIS — Z7982 Long term (current) use of aspirin: Secondary | ICD-10-CM | POA: Insufficient documentation

## 2018-02-17 DIAGNOSIS — I480 Paroxysmal atrial fibrillation: Secondary | ICD-10-CM | POA: Diagnosis not present

## 2018-02-17 DIAGNOSIS — R0602 Shortness of breath: Secondary | ICD-10-CM | POA: Diagnosis not present

## 2018-02-17 DIAGNOSIS — R42 Dizziness and giddiness: Secondary | ICD-10-CM | POA: Diagnosis not present

## 2018-02-17 LAB — I-STAT TROPONIN, ED: Troponin i, poc: 0 ng/mL (ref 0.00–0.08)

## 2018-02-17 LAB — CBC
HCT: 47.5 % (ref 39.0–52.0)
HEMOGLOBIN: 15.7 g/dL (ref 13.0–17.0)
MCH: 30.3 pg (ref 26.0–34.0)
MCHC: 33.1 g/dL (ref 30.0–36.0)
MCV: 91.7 fL (ref 78.0–100.0)
Platelets: 309 10*3/uL (ref 150–400)
RBC: 5.18 MIL/uL (ref 4.22–5.81)
RDW: 13.4 % (ref 11.5–15.5)
WBC: 9.2 10*3/uL (ref 4.0–10.5)

## 2018-02-17 LAB — BASIC METABOLIC PANEL
ANION GAP: 8 (ref 5–15)
BUN: 16 mg/dL (ref 6–20)
CHLORIDE: 105 mmol/L (ref 101–111)
CO2: 26 mmol/L (ref 22–32)
CREATININE: 1.17 mg/dL (ref 0.61–1.24)
Calcium: 9.4 mg/dL (ref 8.9–10.3)
GFR calc non Af Amer: >60 mL/min (ref >60)
Glucose, Bld: 123 mg/dL — ABNORMAL HIGH (ref 65–99)
POTASSIUM: 4.9 mmol/L (ref 3.5–5.1)
SODIUM: 139 mmol/L (ref 135–145)

## 2018-02-17 MED ORDER — METOPROLOL SUCCINATE ER 25 MG PO TB24: 75.0000 mg | ORAL_TABLET | Freq: Every day | ORAL | 11 refills | Status: DC

## 2018-02-17 NOTE — Telephone Encounter (Signed)
New Message   Madeline from [employer] is calling in reference to patients abnormal EKG.

## 2018-02-17 NOTE — Telephone Encounter (Signed)
Follow up    Critical ekg

## 2018-02-17 NOTE — Telephone Encounter (Signed)
Strips reviewed by dr Jens Somcrenshaw. Spoke with pt, Aware of dr Ludwig Clarkscrenshaw's recommendations. Follow up scheduled

## 2018-02-17 NOTE — Telephone Encounter (Signed)
Need strips Jeffery MillersBrian Nielsen

## 2018-02-17 NOTE — Telephone Encounter (Signed)
Call from [employer] that pt HR got up to 190 with A. Fib and 6 beats of V. Tach. They tried to reach pt unable to reach him.   Called pt he state he is feeling fine just a little short of breath at times. He has no c/o of palpitations at this time. Review strips with DOD, Tresa EndoKelly, recommends pt to go to the ED to be evaluated. Pt aware and going to have his neighbor drive him. ED charge nurse made aware of pt condition and on way to the ED via personal car  Afternoon cardiac reports being scanned in for review when pt arrives in the ED.

## 2018-02-17 NOTE — Telephone Encounter (Signed)
New Message   Greig Castillandrew from [employer] is calling in with a critical EKG.

## 2018-02-17 NOTE — Telephone Encounter (Signed)
Follow up     Patient is returning Jeffery Nielsen call

## 2018-02-17 NOTE — Telephone Encounter (Signed)
Follow up    Madeline with [employer] is call back about patients abnormal EKG.  Please call, they did advise that its critical.

## 2018-02-17 NOTE — Telephone Encounter (Signed)
Monitoring service called pt had auto detect of VTach 8 beats.  I see he is in ER.  No MD note yet.

## 2018-02-17 NOTE — Telephone Encounter (Signed)
[facility] [employer], that pt HR ranging from 170-180. Pt had 9 beats of V-tac with HR at 190. He tried to reach out to pt without success.   Left message for pt on his cell phone.  Tried to reach pt at work, but there was only an Insurance claims handleroffice generic voicemail. Also, tried to reach out to pt emergency contact no answer on phone.   Called pt back, he is feeling fine no complaints at this time. Does have occational SOB that comes and goes but nothing that is stopping him from doing what he needs to get done. Pt has not done any strenuous activity to increase his HR in the last hour. Pt educated to call out office is the SOB gets worse and does not go away. Pt verbalized understanding, no additional questions at this time.

## 2018-02-17 NOTE — ED Triage Notes (Signed)
Pt reporting palpitations which he was not aware of, his doctors office called him to inform him that his Holter monitor picked up an abnormal rhythm; pt states he took an ED pill last night and didn't know if that had anything to do with it; pt denies CP, lightheadedness, n/v, diaphoresis;

## 2018-02-17 NOTE — Telephone Encounter (Signed)
Received call from [employer] regarding reading. Patient had auto trigger at 8:02 CT of Atrial Flutter HR 90-100. Monitor placed 01-27-18. Tried to call patient, left message to call back. Will forward to Dr Jens Somrenshaw for review

## 2018-02-18 ENCOUNTER — Telehealth (HOSPITAL_COMMUNITY): Payer: Self-pay | Admitting: *Deleted

## 2018-02-18 ENCOUNTER — Emergency Department (HOSPITAL_COMMUNITY)
Admission: EM | Admit: 2018-02-18 | Discharge: 2018-02-18 | Disposition: A | Discharge status: Home / Self Care | Payer: 59 | Attending: Emergency Medicine | Admitting: Emergency Medicine

## 2018-02-18 DIAGNOSIS — I4891 Unspecified atrial fibrillation: Secondary | ICD-10-CM

## 2018-02-18 MED ORDER — APIXABAN 5 MG PO TABS
5.0000 mg | ORAL_TABLET | Freq: Two times a day (BID) | ORAL | Status: DC
  Filled 2018-02-18 (×2): qty 1

## 2018-02-18 NOTE — Telephone Encounter (Signed)
Message  Received: Today  Message Contents  Shona Simpsonarter, Renald Haithcock S, RN  Regis BillPratt, Melinda B        Pt called back - states he just wants to wait and be seen on Monday with Azalee CourseHao Meng at Dr. Ludwig Clarksrenshaw's office.  Trenese Haft   Previous Messages    ----- Message -----  From: Burnell BlanksPratt, Melinda B  Sent: 02/18/2018  2:20 PM  To: Shona SimpsonStacy S Vernis Cabacungan, RN   GREAT Danae ChenHANK YOU   ----- Message -----  From: Shona Simpsonarter, Antone Summons S, RN  Sent: 02/18/2018 10:46 AM  To: Burnell BlanksMelinda B Pratt   Yes he can be seen here from ER report. I have left him a message to see if tomorrow at [address] works. Haven't heard back from him yet.  Kennyth ArnoldStacy  ----- Message -----  From: Burnell BlanksPratt, Melinda B  Sent: 02/18/2018 10:39 AM  To: Shona SimpsonStacy S Gratia Disla, RN, Awilda BillAmanda D Becker, CMA   Hello ladies   Can I get an appointment for Mr Noelle PennerGibbs based on ED report or does he need to see PA/Crenshaw first. Patient is currently wearing a monitor   3:59 AM  Spoke with Dr. Shirlee LatchMcLean, who advised to double patient's metoprolol dose, from 75 mg nightly to twice a day. Advised him to call A. fib clinic in the morning to get an appointment today or tomorrow. No blood thinner since low chadvasc score. Patient continues to be in a sinus rhythm, vital signs remained normal. Patient agrees to the plan.   Thanks  [employer] CorporationMelinda      Called and offered pt appointment in afib clinic for 3/27 -- pt declined stating he has an appointment with Azalee CourseHao Meng on 4/1 and will just keep this appointment. Phone number given to patient should he change his mind. Pt appreciative of call.

## 2018-02-18 NOTE — ED Notes (Signed)
ED Provider at bedside. 
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
## 2018-02-18 NOTE — Discharge Instructions (Addendum)
Please call cardiology office tomorrow and asked to be scheduled with atrial fibrillation clinic today.  Advised them that we spoke with Dr. Shirlee LatchMcLean.  Double your metoprolol, to twice a day. Return if worsening symptoms.

## 2018-02-24 ENCOUNTER — Encounter: Payer: Self-pay | Admitting: Physician Assistant

## 2018-02-24 ENCOUNTER — Telehealth: Payer: Self-pay | Admitting: *Deleted

## 2018-02-24 ENCOUNTER — Ambulatory Visit: Payer: 59 | Admitting: Physician Assistant

## 2018-02-24 VITALS — BP 138/90 | HR 88 | Ht 71.0 in | Wt 291.0 lb

## 2018-02-24 DIAGNOSIS — R079 Chest pain, unspecified: Secondary | ICD-10-CM | POA: Diagnosis not present

## 2018-02-24 DIAGNOSIS — I4892 Unspecified atrial flutter: Secondary | ICD-10-CM | POA: Diagnosis not present

## 2018-02-24 DIAGNOSIS — I480 Paroxysmal atrial fibrillation: Secondary | ICD-10-CM

## 2018-02-24 DIAGNOSIS — I10 Essential (primary) hypertension: Secondary | ICD-10-CM

## 2018-02-24 DIAGNOSIS — I4729 Other ventricular tachycardia: Secondary | ICD-10-CM

## 2018-02-24 DIAGNOSIS — I472 Ventricular tachycardia: Secondary | ICD-10-CM | POA: Diagnosis not present

## 2018-02-24 MED ORDER — METOPROLOL SUCCINATE ER 100 MG PO TB24: 100.0000 mg | ORAL_TABLET | Freq: Two times a day (BID) | ORAL | 11 refills | Status: DC

## 2018-02-24 NOTE — Telephone Encounter (Signed)
Spoke with patient regarding appointment for A Fib clinic-----Monday 03/10/18 @ 1:30pm.  Patient voiced his understanding.
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
## 2018-02-24 NOTE — Patient Instructions (Signed)
Medication Instructions: INCREASE the Metoprolol Succinate to 100 mg twice daily.  If you need a refill on your cardiac medications before your next appointment, please call your pharmacy.   Labwork: Your provider would like for you to have the following labs today: TSH   Procedures/Testing: Your physician has requested that you have a lexiscan myoview. For further information please visit [email or web address] Please follow instruction sheet, as given. This will take place at [address], [address]   Follow-Up: Your physician wants you to follow-up in: 2 weeks at the Atrial Fibrillation clinic and  3 months with Dr. Jens Somrenshaw only.    Thank you for choosing [facility]!!

## 2018-02-25 LAB — TSH: TSH: 0.896 u[IU]/mL (ref 0.450–4.500)

## 2018-02-25 NOTE — Progress Notes (Signed)
Thyroid level normal.

## 2018-02-27 ENCOUNTER — Telehealth (HOSPITAL_COMMUNITY): Payer: Self-pay

## 2018-02-27 NOTE — Telephone Encounter (Signed)
Encounter complete. 

## 2018-03-04 ENCOUNTER — Ambulatory Visit (HOSPITAL_COMMUNITY)
Admission: RE | Admit: 2018-03-04 | Discharge: 2018-03-04 | Disposition: A | Discharge status: Home / Self Care | Payer: 59 | Source: Ambulatory Visit | Attending: Cardiology | Admitting: Cardiology

## 2018-03-04 DIAGNOSIS — I10 Essential (primary) hypertension: Secondary | ICD-10-CM

## 2018-03-04 DIAGNOSIS — I480 Paroxysmal atrial fibrillation: Secondary | ICD-10-CM

## 2018-03-04 MED ORDER — TECHNETIUM TC 99M TETROFOSMIN IV KIT
29.2000 | PACK | Freq: Once | INTRAVENOUS | Status: AC | PRN
  Administered 2018-03-04: 29.2 via INTRAVENOUS
  Filled 2018-03-04: qty 30

## 2018-03-04 MED ORDER — REGADENOSON 0.4 MG/5ML IV SOLN
0.4000 mg | Freq: Once | INTRAVENOUS | Status: AC
  Administered 2018-03-04: 0.4 mg via INTRAVENOUS

## 2018-03-05 ENCOUNTER — Ambulatory Visit (HOSPITAL_COMMUNITY)
Admission: RE | Admit: 2018-03-05 | Discharge: 2018-03-05 | Disposition: A | Discharge status: Home / Self Care | Payer: 59 | Source: Ambulatory Visit | Attending: Cardiovascular Disease | Admitting: Cardiovascular Disease

## 2018-03-05 LAB — MYOCARDIAL PERFUSION IMAGING
CHL CUP NUCLEAR SRS: 3
CSEPPHR: 101 {beats}/min
LV dias vol: 125 mL (ref 62–150)
LV sys vol: 59 mL
Rest HR: 73 {beats}/min
SDS: 2
SSS: 5
TID: 0.97

## 2018-03-05 MED ORDER — TECHNETIUM TC 99M TETROFOSMIN IV KIT
28.8000 | PACK | Freq: Once | INTRAVENOUS | Status: AC | PRN
Start: 2018-03-05 — End: 2018-03-05
  Administered 2018-03-05: 28.8 via INTRAVENOUS

## 2018-03-10 ENCOUNTER — Other Ambulatory Visit: Payer: Self-pay

## 2018-03-10 ENCOUNTER — Encounter (HOSPITAL_COMMUNITY): Payer: Self-pay | Admitting: Nurse Practitioner

## 2018-03-10 ENCOUNTER — Ambulatory Visit (HOSPITAL_COMMUNITY)
Admission: RE | Admit: 2018-03-10 | Discharge: 2018-03-10 | Disposition: A | Discharge status: Home / Self Care | Payer: 59 | Source: Ambulatory Visit | Attending: Nurse Practitioner | Admitting: Nurse Practitioner

## 2018-03-10 ENCOUNTER — Telehealth: Payer: Self-pay | Admitting: *Deleted

## 2018-03-10 VITALS — BP 130/86 | HR 82 | Ht 71.0 in | Wt 295.0 lb

## 2018-03-10 DIAGNOSIS — I4891 Unspecified atrial fibrillation: Secondary | ICD-10-CM | POA: Insufficient documentation

## 2018-03-10 DIAGNOSIS — I4892 Unspecified atrial flutter: Secondary | ICD-10-CM | POA: Diagnosis not present

## 2018-03-10 DIAGNOSIS — J45909 Unspecified asthma, uncomplicated: Secondary | ICD-10-CM | POA: Diagnosis not present

## 2018-03-10 DIAGNOSIS — I480 Paroxysmal atrial fibrillation: Secondary | ICD-10-CM

## 2018-03-10 DIAGNOSIS — I10 Essential (primary) hypertension: Secondary | ICD-10-CM

## 2018-03-10 DIAGNOSIS — I472 Ventricular tachycardia: Secondary | ICD-10-CM | POA: Diagnosis not present

## 2018-03-10 DIAGNOSIS — Z79899 Other long term (current) drug therapy: Secondary | ICD-10-CM | POA: Insufficient documentation

## 2018-03-10 DIAGNOSIS — Z7982 Long term (current) use of aspirin: Secondary | ICD-10-CM | POA: Insufficient documentation

## 2018-03-10 DIAGNOSIS — Z825 Family history of asthma and other chronic lower respiratory diseases: Secondary | ICD-10-CM | POA: Diagnosis not present

## 2018-03-10 DIAGNOSIS — Z880 Allergy status to penicillin: Secondary | ICD-10-CM | POA: Diagnosis not present

## 2018-03-10 DIAGNOSIS — Z8249 Family history of ischemic heart disease and other diseases of the circulatory system: Secondary | ICD-10-CM | POA: Insufficient documentation

## 2018-03-10 DIAGNOSIS — Z6841 Body Mass Index (BMI) 40.0 and over, adult: Principal | ICD-10-CM

## 2018-03-10 NOTE — Telephone Encounter (Signed)
-----   Message from Shona SimpsonStacy S Carter, RN sent at 03/10/2018  2:27 PM EDT ----- Regarding: sleep study Pt needs sleep study for afib. Thanks! Kennyth ArnoldStacy

## 2018-03-10 NOTE — Telephone Encounter (Signed)
[facility]

## 2018-03-10 NOTE — Addendum Note (Signed)
Addended by: Reesa ChewJONES, Dessie Delcarlo G on: 03/10/2018 04:13 PM   Modules accepted: Orders

## 2018-03-10 NOTE — Telephone Encounter (Signed)
Order sent to pre cert today 
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
## 2018-03-12 ENCOUNTER — Telehealth: Payer: Self-pay | Admitting: *Deleted

## 2018-03-12 NOTE — Telephone Encounter (Signed)
Left in lab sleep study appointment on patient's cell voice mail ( okay per DPR). [facility] contact information provided to patient to call if appointment needs to be rescheduled.

## 2018-03-22 ENCOUNTER — Other Ambulatory Visit: Payer: Self-pay | Admitting: Physician Assistant

## 2018-03-24 ENCOUNTER — Ambulatory Visit (HOSPITAL_COMMUNITY): Payer: 59

## 2018-03-25 ENCOUNTER — Encounter: Payer: Self-pay | Admitting: Internal Medicine

## 2018-03-27 ENCOUNTER — Ambulatory Visit (HOSPITAL_BASED_OUTPATIENT_CLINIC_OR_DEPARTMENT_OTHER): Payer: 59 | Attending: Nurse Practitioner | Admitting: Cardiovascular Disease

## 2018-03-27 VITALS — Ht 71.0 in | Wt 295.0 lb

## 2018-03-27 DIAGNOSIS — G4733 Obstructive sleep apnea (adult) (pediatric): Secondary | ICD-10-CM | POA: Diagnosis not present

## 2018-03-27 DIAGNOSIS — I4891 Unspecified atrial fibrillation: Secondary | ICD-10-CM | POA: Insufficient documentation

## 2018-03-27 DIAGNOSIS — Z6841 Body Mass Index (BMI) 40.0 and over, adult: Secondary | ICD-10-CM | POA: Diagnosis not present

## 2018-03-27 DIAGNOSIS — E66813 Obesity, class 3: Secondary | ICD-10-CM

## 2018-03-31 ENCOUNTER — Ambulatory Visit (HOSPITAL_COMMUNITY)
Admission: RE | Admit: 2018-03-31 | Discharge: 2018-03-31 | Disposition: A | Discharge status: Home / Self Care | Payer: 59 | Source: Ambulatory Visit | Attending: Nurse Practitioner | Admitting: Nurse Practitioner

## 2018-03-31 DIAGNOSIS — I4891 Unspecified atrial fibrillation: Secondary | ICD-10-CM | POA: Diagnosis present

## 2018-03-31 DIAGNOSIS — I480 Paroxysmal atrial fibrillation: Secondary | ICD-10-CM | POA: Insufficient documentation

## 2018-03-31 DIAGNOSIS — I517 Cardiomegaly: Secondary | ICD-10-CM | POA: Insufficient documentation

## 2018-03-31 NOTE — Progress Notes (Signed)
  Echocardiogram has been performed.  Jeffery Nielsen 03/31/2018, 8:43 AM

## 2018-04-01 ENCOUNTER — Encounter (HOSPITAL_BASED_OUTPATIENT_CLINIC_OR_DEPARTMENT_OTHER): Payer: 59

## 2018-04-14 ENCOUNTER — Encounter: Payer: Self-pay | Admitting: Internal Medicine

## 2018-04-14 ENCOUNTER — Ambulatory Visit (INDEPENDENT_AMBULATORY_CARE_PROVIDER_SITE_OTHER): Payer: 59 | Admitting: Internal Medicine

## 2018-04-14 VITALS — BP 146/86 | HR 85 | Ht 71.0 in | Wt 292.0 lb

## 2018-04-14 DIAGNOSIS — I480 Paroxysmal atrial fibrillation: Secondary | ICD-10-CM | POA: Diagnosis not present

## 2018-04-14 DIAGNOSIS — I472 Ventricular tachycardia: Secondary | ICD-10-CM | POA: Diagnosis not present

## 2018-04-14 DIAGNOSIS — I4729 Other ventricular tachycardia: Secondary | ICD-10-CM

## 2018-04-14 MED ORDER — FLECAINIDE ACETATE 100 MG PO TABS: ORAL_TABLET | ORAL | 3 refills | Status: DC

## 2018-04-14 NOTE — Patient Instructions (Addendum)
Medication Instructions:  Start taking flecainide 100 mg tablets. 1.  Start taking a 1/2 tablet by mouth twice a day for ONE WEEK 04/15/2018 to 04/22/2018. 2.  THEN INCREASE to 1 tablet by mouth twice a day.  Labwork: You will get a HGB A1C on the same day as your 12 lead EKG.  Testing/Procedures: You will get a 12 lead EKG in 2 weeks.    Your physician has requested that you have an exercise tolerance test. For further information please visit [email or web address] Please also follow instruction sheet, as given.  You will have a stress test in 3-4 weeks.  Follow-Up: Your physician wants you to follow-up in: 6 weeks with Dr. Ladona Ridgel.    Any Other Special Instructions Will Be Listed Below (If Applicable).  If you need a refill on your cardiac medications before your next appointment, please call your pharmacy.   Flecainide tablets What is this medicine? FLECAINIDE (FLEK a nide) is an antiarrhythmic drug. This medicine is used to prevent irregular heart rhythm. It can also slow down fast heartbeats called tachycardia. This medicine may be used for other purposes; ask your health care provider or pharmacist if you have questions. COMMON BRAND NAME(S): Tambocor What should I tell my health care provider before I take this medicine? They need to know if you have any of these conditions: -abnormal levels of potassium in the blood -heart disease including heart rhythm and heart rate problems -kidney or liver disease -recent heart attack -an unusual or allergic reaction to flecainide, local anesthetics, other medicines, foods, dyes, or preservatives -pregnant or trying to get pregnant -breast-feeding How should I use this medicine? Take this medicine by mouth with a glass of water. Follow the directions on the prescription label. You can take this medicine with or without food. Take your doses at regular intervals. Do not take your medicine more often than directed. Do not stop taking this  medicine suddenly. This may cause serious, heart-related side effects. If your doctor wants you to stop the medicine, the dose may be slowly lowered over time to avoid any side effects. Talk to your pediatrician regarding the use of this medicine in children. While this drug may be prescribed for children as young as 1 year of age for selected conditions, precautions do apply. Overdosage: If you think you have taken too much of this medicine contact a poison control center or emergency room at once. NOTE: This medicine is only for you. Do not share this medicine with others. What if I miss a dose? If you miss a dose, take it as soon as you can. If it is almost time for your next dose, take only that dose. Do not take double or extra doses. What may interact with this medicine? Do not take this medicine with any of the following medications: -amoxapine -arsenic trioxide -certain antibiotics like clarithromycin, erythromycin, gatifloxacin, gemifloxacin, levofloxacin, moxifloxacin, sparfloxacin, or troleandomycin -certain antidepressants called tricyclic antidepressants like amitriptyline, imipramine, or nortriptyline -certain medicines to control heart rhythm like disopyramide, dofetilide, encainide, moricizine, procainamide, propafenone, and quinidine -cisapride -cyclobenzaprine -delavirdine -droperidol -haloperidol -hawthorn -imatinib -levomethadyl -maprotiline -medicines for malaria like chloroquine and halofantrine -pentamidine -phenothiazines like chlorpromazine, mesoridazine, prochlorperazine, thioridazine -pimozide -quinine -ranolazine -ritonavir -sertindole -ziprasidone This medicine may also interact with the following medications: -cimetidine -medicines for angina or high blood pressure -medicines to control heart rhythm like amiodarone and digoxin This list may not describe all possible interactions. Give your health care provider a list of all the medicines, herbs,  non-prescription drugs, or dietary supplements you use. Also tell them if you smoke, drink alcohol, or use illegal drugs. Some items may interact with your medicine. What should I watch for while using this medicine? Visit your doctor or health care professional for regular checks on your progress. Because your condition and the use of this medicine carries some risk, it is a good idea to carry an identification card, necklace or bracelet with details of your condition, medications and doctor or health care professional. Check your blood pressure and pulse rate regularly. Ask your health care professional what your blood pressure and pulse rate should be, and when you should contact him or her. Your doctor or health care professional also may schedule regular blood tests and electrocardiograms to check your progress. You may get drowsy or dizzy. Do not drive, use machinery, or do anything that needs mental alertness until you know how this medicine affects you. Do not stand or sit up quickly, especially if you are an older patient. This reduces the risk of dizzy or fainting spells. Alcohol can make you more dizzy, increase flushing and rapid heartbeats. Avoid alcoholic drinks. What side effects may I notice from receiving this medicine? Side effects that you should report to your doctor or health care professional as soon as possible: -chest pain, continued irregular heartbeats -difficulty breathing -swelling of the legs or feet -trembling, shaking -unusually weak or tired Side effects that usually do not require medical attention (report to your doctor or health care professional if they continue or are bothersome): -blurred vision -constipation -headache -nausea, vomiting -stomach pain This list may not describe all possible side effects. Call your doctor for medical advice about side effects. You may report side effects to FDA at 1-800-FDA-1088. Where should I keep my medicine? Keep out of the  reach of children. Store at room temperature between 15 and 30 degrees C (59 and 86 degrees F). Protect from light. Keep container tightly closed. Throw away any unused medicine after the expiration date. NOTE: This sheet is a summary. It may not cover all possible information. If you have questions about this medicine, talk to your doctor, pharmacist, or health care provider.  2018 Elsevier/Gold Standard (2008-03-17 16:46:09)
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
## 2018-04-17 ENCOUNTER — Encounter (HOSPITAL_BASED_OUTPATIENT_CLINIC_OR_DEPARTMENT_OTHER): Payer: Self-pay | Admitting: Cardiovascular Disease

## 2018-04-17 NOTE — Procedures (Signed)
Patient Name: Jeffery Nielsen, Jeffery Nielsen Date: 03/27/2018 Gender: Male D.O.B: 05/08/1966 Age (years): 51 Referring Provider: Newman Nip Height (inches): 71 Interpreting Physician: [name] MD, ABSM Weight (lbs): 295 RPSGT: Bonita Quin BMI: 41 MRN: [id number] Neck Size: 19.00  CLINICAL INFORMATION Sleep Study Type: NPSG  Indication for sleep study: Daytime Fatigue, Hypertension, Morbid Obesity, Morning Headaches, Snoring  Epworth Sleepiness Score: 17  SLEEP STUDY TECHNIQUE As per the AASM Manual for the Scoring of Sleep and Associated Events v2.3 (April 2016) with a hypopnea requiring 4% desaturations.  The channels recorded and monitored were frontal, central and occipital EEG, electrooculogram (EOG), submentalis EMG (chin), nasal and oral airflow, thoracic and abdominal wall motion, anterior tibialis EMG, snore microphone, electrocardiogram, and pulse oximetry.  MEDICATIONS     albuterol (PROAIR HFA) 108 (90 Base) MCG/ACT inhaler         aspirin EC 81 MG tablet         enalapril (VASOTEC) 20 MG tablet         flecainide (TAMBOCOR) 100 MG tablet         hydrochlorothiazide (MICROZIDE) 12.5 MG capsule         metoprolol succinate (TOPROL-XL) 100 MG 24 hr tablet         sildenafil (VIAGRA) 100 MG tablet      Medications self-administered by patient taken the night of the study : METOPROLOL  SLEEP ARCHITECTURE The study was initiated at 9:51:05 PM and ended at 4:44:48 AM.  Sleep onset time was 3.6 minutes and the sleep efficiency was 87.6%%. The total sleep time was 362.6 minutes.  Stage REM latency was 113.5 minutes.  The patient spent 16.0%% of the night in stage N1 sleep, 67.0%% in stage N2 sleep, 1.9%% in stage N3 and 15.03% in REM.  Alpha intrusion was absent.  Supine sleep was 15.70%.  RESPIRATORY PARAMETERS The overall apnea/hypopnea index (AHI) was 13.1 per hour. The respiratory disturbance index (RDI) was 21.7/h. There were 33 total apneas,  including 33 obstructive, 0 central and 0 mixed apneas. There were 46 hypopneas and 52 RERAs.  The AHI during Stage REM sleep was 51.7 per hour.  AHI while supine was 7.4 per hour.  The mean oxygen saturation was 94.8%. The minimum SpO2 during sleep was 75.0%.  loud snoring was noted during this study.  CARDIAC DATA The 2 lead EKG demonstrated atrial fibrillation. The mean heart rate was 72.9 beats per minute. Other EKG findings include: PVCs.  LEG MOVEMENT DATA The total PLMS were 0 with a resulting PLMS index of 0.0. Associated arousal with leg movement index was 0.2 .  IMPRESSIONS - Mild -moderate obstructive sleep apnea overall (AH13.1/h; RDI 21.7/h);  however, sleep apnea was very severe doing REM sleep with an AHI 51.7/h. - No significant central sleep apnea occurred during this study (CAI = 0.0/h). - Significant oxygen desaturation to a nadir of 75%. - The patient snored with loud snoring volume. - EKG findings include PVCs. - Clinically significant periodic limb movements did not occur during sleep. No significant associated arousals.  DIAGNOSIS - Obstructive Sleep Apnea (327.23 [G47.33 ICD-10]) - Nocturnal Hypoxemia (327.26 [G47.36 ICD-10])  RECOMMENDATIONS - Therapeutic CPAP titration to determine optimal pressure required to alleviate sleep disordered breathing. - Efforts should be made to optimize nasal and oral pharyngeal patency. - Avoid alcohol, sedatives and other CNS depressants that may worsen sleep apnea and disrupt normal sleep architecture. - Sleep hygiene should be reviewed to assess factors that may improve sleep quality. -  Weight management (BMI 41) and regular exercise should be initiated.  [Electronically signed] 04/17/2018 04:07 PM  [name] MD, [facility],  ABSM Diplomate, American Board of Sleep Medicine   NPI: [id number] [facility] PH: [phone]   FX: [phone] ACCREDITED BY THE AMERICAN ACADEMY OF SLEEP  MEDICINE

## 2018-04-18 ENCOUNTER — Other Ambulatory Visit: Payer: Self-pay | Admitting: Cardiovascular Disease

## 2018-04-18 ENCOUNTER — Telehealth: Payer: Self-pay | Admitting: *Deleted

## 2018-04-18 DIAGNOSIS — G4733 Obstructive sleep apnea (adult) (pediatric): Secondary | ICD-10-CM

## 2018-04-18 NOTE — Telephone Encounter (Signed)
Submitted PA request to [facility] for in lab CPAP titration.

## 2018-04-25 ENCOUNTER — Telehealth: Payer: Self-pay | Admitting: *Deleted

## 2018-04-25 NOTE — Telephone Encounter (Signed)
Received a call from [facility] denying request for in lab CPAP titration. Recommend APAP or call [phone] for peer to peer. Message routed to Dr Tresa Endo for further review and recommendation.

## 2018-04-28 ENCOUNTER — Ambulatory Visit: Payer: 59

## 2018-04-28 ENCOUNTER — Other Ambulatory Visit: Payer: 59

## 2018-05-01 ENCOUNTER — Other Ambulatory Visit: Payer: Self-pay | Admitting: Family Medicine

## 2018-05-05 NOTE — Telephone Encounter (Signed)
I have never seen the patient;  He has h/o NSVT and PAF with severe OSA during REM sleep and marked decrease Oxygen sat.  Recommend ideal in-lab titration. If unable to get then Auto - pap titration  6- 20 cwp.

## 2018-05-06 NOTE — Telephone Encounter (Signed)
Left message for patient to return a call to discuss sleep study results and recommendations. 

## 2018-05-06 NOTE — Telephone Encounter (Signed)
-----   Message from Thomas A Kelly, MD sent at 04/17/2018  4:12 PM EDT ----- Linday Rhodes, please notify the patient the results of the sleep study.  Schedule patient for CPAP titration study. 

## 2018-05-07 NOTE — Telephone Encounter (Signed)
Left message to return a call to discuss sleep study results. 

## 2018-05-07 NOTE — Telephone Encounter (Signed)
-----   Message from Lennette Biharihomas A Kelly, MD sent at 04/17/2018  4:12 PM EDT ----- Burna MortimerWanda, please notify the patient the results of the sleep study.  Schedule patient for CPAP titration study.

## 2018-05-12 NOTE — Telephone Encounter (Signed)
Left message # 3 to return a call to discuss sleep study results and recommendations. If no response no further attempts will be made. Attempted to contact wife's # kept getting busy signal.

## 2018-05-12 NOTE — Telephone Encounter (Signed)
-----   Message from Thomas A Kelly, MD sent at 04/17/2018  4:12 PM EDT ----- Jeffery Nielsen, please notify the patient the results of the sleep study.  Schedule patient for CPAP titration study. 

## 2018-05-19 NOTE — Telephone Encounter (Signed)
-----   Message from Lennette Biharihomas A Kelly, MD sent at 04/17/2018  4:12 PM EDT ----- Jeffery Nielsen, please notify the patient the results of the sleep study.  Schedule patient for CPAP titration study.

## 2018-05-19 NOTE — Telephone Encounter (Signed)
Left message to call to discuss sleep study appointment.

## 2018-05-19 NOTE — Progress Notes (Signed)
Unable to reach patient after multiple attempts. Will notify at 06/02/18 OV with Dr Jens Somrenshaw.

## 2018-05-26 ENCOUNTER — Ambulatory Visit: Payer: 59 | Admitting: Internal Medicine
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
## 2018-06-02 ENCOUNTER — Ambulatory Visit: Payer: 59 | Admitting: Cardiology

## 2018-06-02 ENCOUNTER — Telehealth: Payer: Self-pay | Admitting: *Deleted

## 2018-06-02 NOTE — Telephone Encounter (Signed)
Left message for either patient or wife to return a call to discuss CPAP set up.

## 2018-06-02 NOTE — Telephone Encounter (Signed)
Multiple attempts to contact patient and wife. Planned to discuss at 06/02/18 office appointment, but it was canceled. Left message again today on wife's voice mail for her or the patient to return a call. No further attempts will be made.

## 2018-06-02 NOTE — Telephone Encounter (Signed)
Patient finally returned a call today after multiple failed attempts to reach him. He was given his sleep study results and recommendations. APAP order faxed to [facility]. [employer] denied in lab titration study.

## 2018-06-17 ENCOUNTER — Telehealth: Payer: Self-pay | Admitting: *Deleted

## 2018-06-17 NOTE — Telephone Encounter (Signed)
[facility] notifying me the patient has declined his CPAP at this time due to $$. States he cannot afford it. Dr Tresa Endokelly as well as ordering provider will be notified.

## 2018-06-17 NOTE — Telephone Encounter (Signed)
Patient has significant oxygen desaturation on his sleep study and had severe sleep apnea particularly during REM sleep.  With his cardiovascular comorbidities including nonsustained VT and atrial fibrillation, I strongly recommend CPAP institution.  If the patient wishes to discuss further, perhaps arrange a office visit with me for discussion of the severity of his sleep apnea in the importance of CPAP initiation.

## 2018-06-18 NOTE — Telephone Encounter (Signed)
Left message for patient to return a call to me. 

## 2018-06-19 ENCOUNTER — Telehealth: Payer: Self-pay | Admitting: *Deleted

## 2018-06-19 NOTE — Telephone Encounter (Signed)
Patient returned a call and was informed appointment requested by Dr Tresa Endokelly to discuss his sleep study. Patient states that he will call back tomorrow to make appointment.

## 2018-06-19 NOTE — Telephone Encounter (Signed)
Left a message to return a call to discuss CPAP.

## 2018-06-19 NOTE — Telephone Encounter (Signed)
Patient returned a call to me and was told per Dr Tresa EndoKelly how important it is for him to begin his CPAP therapy. Dr Tresa Endokelly recommends appointment to discuss. Patient states that he will call back tomorrow to make appointment.

## 2018-07-27 ENCOUNTER — Other Ambulatory Visit: Payer: Self-pay | Admitting: Physician Assistant

## 2018-07-29 ENCOUNTER — Other Ambulatory Visit: Payer: Self-pay

## 2018-07-29 MED ORDER — METOPROLOL SUCCINATE ER 100 MG PO TB24: 100.0000 mg | ORAL_TABLET | Freq: Two times a day (BID) | ORAL | 3 refills | Status: DC

## 2018-10-13 ENCOUNTER — Other Ambulatory Visit: Payer: Self-pay | Admitting: Family Medicine

## 2018-12-01 ENCOUNTER — Ambulatory Visit (INDEPENDENT_AMBULATORY_CARE_PROVIDER_SITE_OTHER): Payer: 59 | Admitting: Family Medicine

## 2018-12-01 VITALS — BP 148/84 | HR 85 | Temp 98.3°F | Wt 294.0 lb

## 2018-12-01 DIAGNOSIS — E6609 Other obesity due to excess calories: Secondary | ICD-10-CM | POA: Diagnosis not present

## 2018-12-01 DIAGNOSIS — Z125 Encounter for screening for malignant neoplasm of prostate: Secondary | ICD-10-CM

## 2018-12-01 DIAGNOSIS — G4733 Obstructive sleep apnea (adult) (pediatric): Secondary | ICD-10-CM | POA: Insufficient documentation

## 2018-12-01 DIAGNOSIS — I10 Essential (primary) hypertension: Secondary | ICD-10-CM | POA: Diagnosis not present

## 2018-12-01 DIAGNOSIS — Z6839 Body mass index (BMI) 39.0-39.9, adult: Secondary | ICD-10-CM | POA: Diagnosis not present

## 2018-12-01 DIAGNOSIS — Z13220 Encounter for screening for lipoid disorders: Secondary | ICD-10-CM

## 2018-12-01 LAB — POCT GLYCOSYLATED HEMOGLOBIN (HGB A1C): Hemoglobin A1C: 5.9 % — AB (ref 4.0–5.6)

## 2018-12-01 NOTE — Assessment & Plan Note (Signed)
Not well controlled.  Strongly encouraged to follow up on CPAP.

## 2018-12-01 NOTE — Assessment & Plan Note (Signed)
BP Readings from Last 3 Encounters:  12/01/18 (!) 148/84  04/14/18 (!) 146/86  03/10/18 130/86   Not controlled.  Strongly encouraged to follow up with CPAP and monitor blood pressure at home

## 2018-12-01 NOTE — Progress Notes (Signed)
Subjective  Jeffery Nielsen is a 53 y.o. male is presenting with the following  Here for check up.  Overall feels well.  OSA Diagnosed with recent sleep study.  Did not follow up on CPAP.  He does have day time tiredness and sleepiness and hypertension   HYPERTENSION Disease Monitoring  Home BP Monitoring (Severity) not checking but has cuff Symptoms - Chest pain- no    Dyspnea- when exerts himself a lot.  Goes away with rest.  Not progressive Medications (Modifying factors) Compliance-  Takes all three regulaly Not taking flecanide. Lightheadedness-  no  Edema- no Timing - continuous  Duration - years ROS - See HPI  OBESITY Current weight/BMI : Body mass index is 41 kg/m.    How long have been obese:  Many years Course:  Has been about the same weight for several year Problems or symptoms it causes:  Fatigue can't exercise as would like   Things have tried to improve:  Intermittent diets and exercise  Patient reports no  vision/ hearing changes,anorexia, weight change, fever ,adenopathy, persistant / recurrent hoarseness, swallowing issues, chest pain, edema,persistant / recurrent cough, hemoptysis, dyspnea(rest, exertional, paroxysmal nocturnal), gastrointestinal  bleeding (melena, rectal bleeding), abdominal pain, excessive heart burn, GU symptoms(dysuria, hematuria, pyuria, voiding/incontinence  Issues) syncope, focal weakness, severe memory loss, concerning skin lesions, depression, anxiety, abnormal bruising/bleeding, major joint swelling.     PMH Lab Review   Potassium  Date Value Ref Range Status  02/17/2018 4.9 3.5 - 5.1 mmol/L Final    Comment:    SLIGHT HEMOLYSIS   Sodium  Date Value Ref Range Status  02/17/2018 139 135 - 145 mmol/L Final  03/27/2017 140 134 - 144 mmol/L Final   Creat  Date Value Ref Range Status  04/28/2013 1.09 0.50 - 1.35 mg/dL Final   Creatinine, Ser  Date Value Ref Range Status  02/17/2018 1.17 0.61 - 1.24 mg/dL Final         Chief Complaint  noted Review of Symptoms - see HPI PMH - Smoking status noted.    Objective Vital Signs reviewed BP (!) 148/84   Pulse 85   Temp 98.3 F (36.8 C) (Oral)   Wt 294 lb (133.4 kg)   SpO2 97%   BMI 41.00 kg/m  Heart - Regular rate and rhythm.  No murmurs, gallops or rubs.    Lungs:  Normal respiratory effort, chest expands symmetrically. Lungs are clear to auscultation, no crackles or wheezes. Abdomen: Obese soft and non-tender without masses, organomegaly or hernias noted.  No guarding or rebound Extremities:  No cyanosis, edema, or deformity noted with good range of motion of all major joints.     Assessments/Plans  See after visit summary for details of patient instuctions  OBESITY Not well controlled.  See after visit summary   HYPERTENSION, BENIGN SYSTEMIC BP Readings from Last 3 Encounters:  12/01/18 (!) 148/84  04/14/18 (!) 146/86  03/10/18 130/86   Not controlled.  Strongly encouraged to follow up with CPAP and monitor blood pressure at home   OSA (obstructive sleep apnea) Not well controlled.  Strongly encouraged to follow up on CPAP.

## 2018-12-01 NOTE — Patient Instructions (Addendum)
Good to see you today!  Thanks for coming in.  Will check your cholesterol and diabetes test and prostate cancer test.  I will call you if your tests are not good.  Otherwise I will send you a letter.  If you do not hear from me with in 2 weeks please call our office.     I would check your blood pressure the goal is almost all the time to be less than 140/85.  If regularly elevated then come in  Follow up on your sleep apnea - CPAP trial   Come back here or your cardiologist for a blood pressure in 6 months   I will look at [facility] and let you know   I think you would feel better and have lower blood pressure if you can lose weight.  What are the motivations that will help you do that?

## 2018-12-01 NOTE — Assessment & Plan Note (Signed)
Not well controlled.  See after visit summary

## 2018-12-02 LAB — LIPID PANEL
CHOL/HDL RATIO: 4.3 ratio (ref 0.0–5.0)
Cholesterol, Total: 155 mg/dL (ref 100–199)
HDL: 36 mg/dL — AB (ref >39)
LDL CALC: 73 mg/dL (ref 0–99)
Triglycerides: 229 mg/dL — ABNORMAL HIGH (ref 0–149)
VLDL CHOLESTEROL CAL: 46 mg/dL — AB (ref 5–40)

## 2018-12-02 LAB — PSA: Prostate Specific Ag, Serum: 0.2 ng/mL (ref 0.0–4.0)

## 2018-12-03 ENCOUNTER — Encounter: Payer: Self-pay | Admitting: Family Medicine

## 2019-02-19 ENCOUNTER — Telehealth (INDEPENDENT_AMBULATORY_CARE_PROVIDER_SITE_OTHER): Payer: 59 | Admitting: Family Medicine

## 2019-02-19 ENCOUNTER — Encounter (HOSPITAL_COMMUNITY): Payer: Self-pay | Admitting: Emergency Medicine

## 2019-02-19 ENCOUNTER — Emergency Department (HOSPITAL_COMMUNITY)
Admission: EM | Admit: 2019-02-19 | Discharge: 2019-02-19 | Disposition: A | Discharge status: Home / Self Care | Payer: 59 | Attending: Emergency Medicine | Admitting: Emergency Medicine

## 2019-02-19 ENCOUNTER — Other Ambulatory Visit: Payer: Self-pay

## 2019-02-19 DIAGNOSIS — J45909 Unspecified asthma, uncomplicated: Secondary | ICD-10-CM | POA: Insufficient documentation

## 2019-02-19 DIAGNOSIS — Z7982 Long term (current) use of aspirin: Secondary | ICD-10-CM | POA: Diagnosis not present

## 2019-02-19 DIAGNOSIS — I10 Essential (primary) hypertension: Secondary | ICD-10-CM | POA: Diagnosis not present

## 2019-02-19 DIAGNOSIS — R000 Tachycardia, unspecified: Secondary | ICD-10-CM | POA: Diagnosis present

## 2019-02-19 DIAGNOSIS — I4891 Unspecified atrial fibrillation: Secondary | ICD-10-CM | POA: Insufficient documentation

## 2019-02-19 DIAGNOSIS — Z79899 Other long term (current) drug therapy: Secondary | ICD-10-CM | POA: Diagnosis not present

## 2019-02-19 LAB — BASIC METABOLIC PANEL
Anion gap: 10 (ref 5–15)
BUN: 14 mg/dL (ref 6–20)
CO2: 23 mmol/L (ref 22–32)
Calcium: 8.9 mg/dL (ref 8.9–10.3)
Chloride: 105 mmol/L (ref 98–111)
Creatinine, Ser: 1.01 mg/dL (ref 0.61–1.24)
GFR calc Af Amer: >60 mL/min (ref >60)
GFR calc non Af Amer: >60 mL/min (ref >60)
Glucose, Bld: 106 mg/dL — ABNORMAL HIGH (ref 70–99)
Potassium: 3.8 mmol/L (ref 3.5–5.1)
Sodium: 138 mmol/L (ref 135–145)

## 2019-02-19 LAB — TROPONIN I: Troponin I: <0.03 ng/mL (ref <0.03)

## 2019-02-19 LAB — CBC WITH DIFFERENTIAL/PLATELET
Abs Immature Granulocytes: 0.02 10*3/uL (ref 0.00–0.07)
Basophils Absolute: 0.1 10*3/uL (ref 0.0–0.1)
Basophils Relative: 1 %
Eosinophils Absolute: 0.1 10*3/uL (ref 0.0–0.5)
Eosinophils Relative: 1 %
HCT: 49.7 % (ref 39.0–52.0)
Hemoglobin: 17.2 g/dL — ABNORMAL HIGH (ref 13.0–17.0)
Immature Granulocytes: 0 %
Lymphocytes Relative: 56 %
Lymphs Abs: 5.5 10*3/uL — ABNORMAL HIGH (ref 0.7–4.0)
MCH: 31.4 pg (ref 26.0–34.0)
MCHC: 34.6 g/dL (ref 30.0–36.0)
MCV: 90.7 fL (ref 80.0–100.0)
Monocytes Absolute: 0.9 10*3/uL (ref 0.1–1.0)
Monocytes Relative: 9 %
Neutro Abs: 3.3 10*3/uL (ref 1.7–7.7)
Neutrophils Relative %: 33 %
Platelets: 337 10*3/uL (ref 150–400)
RBC: 5.48 MIL/uL (ref 4.22–5.81)
RDW: 12.8 % (ref 11.5–15.5)
WBC: 9.8 10*3/uL (ref 4.0–10.5)
nRBC: 0 % (ref 0.0–0.2)

## 2019-02-19 LAB — MAGNESIUM: Magnesium: 2.3 mg/dL (ref 1.7–2.4)

## 2019-02-19 MED ORDER — DILTIAZEM HCL 25 MG/5ML IV SOLN
15.0000 mg | Freq: Once | INTRAVENOUS | Status: AC
  Administered 2019-02-19: 15 mg via INTRAVENOUS
  Filled 2019-02-19: qty 5

## 2019-02-19 MED ORDER — METOPROLOL SUCCINATE ER 50 MG PO TB24: 50.0000 mg | ORAL_TABLET | Freq: Every day | ORAL | 0 refills | Status: DC

## 2019-02-19 MED ORDER — DILTIAZEM HCL 100 MG IV SOLR
5.0000 mg/h | INTRAVENOUS | Status: DC
  Administered 2019-02-19: 5 mg/h via INTRAVENOUS
  Filled 2019-02-19: qty 100

## 2019-02-19 MED ORDER — APIXABAN 5 MG PO TABS: 5.0000 mg | ORAL_TABLET | Freq: Two times a day (BID) | ORAL | 0 refills | Status: DC

## 2019-02-19 NOTE — ED Triage Notes (Signed)
Pt report feeling heart in his chest with SOB since 9 pm last night HR continued all day today
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
## 2019-02-19 NOTE — ED Notes (Signed)
EKG given to EDP,Kohut,MD., for review. 

## 2019-02-19 NOTE — ED Triage Notes (Signed)
Patient reports that he has had tachycardia x 2 days and has a history of irregular heart rhythm.

## 2019-02-19 NOTE — Discharge Instructions (Signed)
Take an additional 50 mg of metoprolol in the evening in addition to what you are already prescribed. I am starting you on a blood thinner called eliquis. Atrial fibrillation significantly increasing your risk for stroke and this is to reduce this risk.

## 2019-02-19 NOTE — Telephone Encounter (Signed)
[facility] Telemedicine Visit  Patient consented to have visit conducted via telephone.  Encounter participants: Patient: Jeffery Nielsen  Provider: [name]  Others (if applicable): no  Chief Complaint: Lightheadness   HPI:  Starting yesterday intermittent episodes of lightheadness and dizziness and feeling his heart is racing.   This AM at work felt very lightheaded and had to sit down better now he is at home. Has been taking all his medications and asa but metoprolol only once day - ordered twice a day   ROS: No chest pain or shortness of breath or nausea and vomiting or diarrhea or bleeding or resp symptoms  Pertinent PMHx: history of SVT afib  O Alert coherent Checked his vitals on his blood pressure machine 127/85 pulse 98 sitting   Assessment/Plan:  Atrial fibrillation (HCC) See recent cardiology notes.  I think he is experiencing tachycardia (svt or afib) Given no chest pain or shortness of breath and stable home vs ok to ask him to resume his twice a day metoprolol (he was taking only once a day) and to report back via mychart  He agrees.      Time spent on phone with patient: 12 minutes

## 2019-02-19 NOTE — Assessment & Plan Note (Signed)
See recent cardiology notes.  I think he is experiencing tachycardia (svt or afib) Given no chest pain or shortness of breath and stable home vs ok to ask him to resume his twice a day metoprolol (he was taking only once a day) and to report back via mychart  He agrees.

## 2019-02-20 ENCOUNTER — Encounter: Payer: Self-pay | Admitting: Family Medicine

## 2019-02-20 ENCOUNTER — Telehealth (HOSPITAL_COMMUNITY): Payer: Self-pay | Admitting: *Deleted

## 2019-02-20 NOTE — Telephone Encounter (Signed)
LMOM for pt to clbk to sched virtual visit.  Pt has not started flecainide.  Pt has questions about ablation per notes.

## 2019-02-24 ENCOUNTER — Inpatient Hospital Stay (HOSPITAL_BASED_OUTPATIENT_CLINIC_OR_DEPARTMENT_OTHER)
Admission: RE | Admit: 2019-02-24 | Discharge: 2019-02-24 | Disposition: A | Discharge status: Home / Self Care | Payer: 59 | Source: Ambulatory Visit | Attending: Physician Assistant | Admitting: Physician Assistant

## 2019-02-24 ENCOUNTER — Encounter (HOSPITAL_COMMUNITY): Payer: Self-pay

## 2019-02-24 ENCOUNTER — Other Ambulatory Visit (HOSPITAL_COMMUNITY): Payer: Self-pay | Admitting: *Deleted

## 2019-02-24 DIAGNOSIS — I480 Paroxysmal atrial fibrillation: Secondary | ICD-10-CM

## 2019-02-24 MED ORDER — DRONEDARONE HCL 400 MG PO TABS: 400.0000 mg | ORAL_TABLET | Freq: Two times a day (BID) | ORAL | 6 refills | Status: DC

## 2019-02-25 ENCOUNTER — Encounter (HOSPITAL_COMMUNITY): Payer: Self-pay | Admitting: *Deleted

## 2019-02-25 ENCOUNTER — Telehealth (HOSPITAL_COMMUNITY): Payer: Self-pay | Admitting: *Deleted
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
## 2019-02-25 NOTE — Telephone Encounter (Signed)
error 

## 2019-02-26 ENCOUNTER — Telehealth (HOSPITAL_COMMUNITY): Payer: Self-pay | Admitting: *Deleted

## 2019-02-26 NOTE — Telephone Encounter (Signed)
preauthorization approved through [facility] for Multaq 400mg  BID through 02/26/2020. PA #[id number]

## 2019-03-05 ENCOUNTER — Other Ambulatory Visit: Payer: Self-pay

## 2019-03-05 ENCOUNTER — Ambulatory Visit (HOSPITAL_COMMUNITY)
Admission: RE | Admit: 2019-03-05 | Discharge: 2019-03-05 | Disposition: A | Discharge status: Home / Self Care | Payer: 59 | Source: Ambulatory Visit | Attending: Physician Assistant | Admitting: Physician Assistant

## 2019-03-05 ENCOUNTER — Encounter (HOSPITAL_COMMUNITY): Payer: Self-pay | Admitting: Physician Assistant

## 2019-03-05 VITALS — BP 126/74 | HR 74

## 2019-03-05 DIAGNOSIS — I4891 Unspecified atrial fibrillation: Secondary | ICD-10-CM | POA: Diagnosis not present

## 2019-03-05 NOTE — Progress Notes (Addendum)
Pt in for EKG since starting Multaq.  Pt has been taking medication twice and day and reports no side effects.  To be reviewed by Jorja Loa, PA.   Pt returns for ECG today which shows SR HR 74 with PR 156, QTc 424. Patient tolerating medication well with no heart racing or palpitations. Continue Multaq 400 mg BID and metoprolol as prescribed. Follow up in AF clinic in 3 months.

## 2019-04-16 ENCOUNTER — Other Ambulatory Visit: Payer: Self-pay | Admitting: Family Medicine

## 2019-07-29 ENCOUNTER — Other Ambulatory Visit: Payer: Self-pay

## 2019-07-29 MED ORDER — METOPROLOL SUCCINATE ER 100 MG PO TB24: 100.0000 mg | ORAL_TABLET | Freq: Two times a day (BID) | ORAL | 0 refills | Status: DC

## 2019-08-28 ENCOUNTER — Other Ambulatory Visit: Payer: Self-pay | Admitting: Cardiology

## 2019-10-03 ENCOUNTER — Other Ambulatory Visit: Payer: Self-pay

## 2019-10-03 DIAGNOSIS — Z20822 Contact with and (suspected) exposure to covid-19: Secondary | ICD-10-CM

## 2019-10-05 LAB — NOVEL CORONAVIRUS, NAA: SARS-CoV-2, NAA: DETECTED — AB

## 2019-10-06 ENCOUNTER — Telehealth: Payer: Self-pay | Admitting: Critical Care Medicine

## 2019-10-06 ENCOUNTER — Other Ambulatory Visit: Payer: Self-pay | Admitting: Cardiology

## 2019-10-06 NOTE — Telephone Encounter (Signed)
I was able to reach this patient had a positive Covid test on November 7.  The patient has had mild symptoms.  He has had a headache and nasal stuffiness with sore throat and some muscle aches.  He has had low-grade fever.  He denies any cough or shortness of breath.  He has no gastrointestinal symptoms.  He does have a primary care provider and will be in touch with the primary care provider of the results.  We will forward the results to the patient's primary care provider.  He is uncertain where he acquired the virus.  He has been in isolation and remains so.  He understands the time course to stay in isolation.  He knows the health department may be in touch with him.

## 2019-10-06 NOTE — Telephone Encounter (Signed)
-----   Message from Carlisle Beers, RN sent at 10/05/2019  2:44 PM EST ----- results routed to Dr's Joya Gaskins and [place]

## 2019-10-08 ENCOUNTER — Encounter: Payer: Self-pay | Admitting: Family Medicine

## 2019-12-05 ENCOUNTER — Other Ambulatory Visit: Payer: Self-pay | Admitting: *Deleted

## 2019-12-05 DIAGNOSIS — Z1152 Encounter for screening for COVID-19: Secondary | ICD-10-CM

## 2019-12-06 LAB — SPECIMEN STATUS REPORT

## 2019-12-06 LAB — NOVEL CORONAVIRUS, NAA: SARS-CoV-2, NAA: NOT DETECTED

## 2019-12-08 ENCOUNTER — Other Ambulatory Visit: Payer: Self-pay | Admitting: Family Medicine

## 2019-12-17 ENCOUNTER — Other Ambulatory Visit: Payer: Self-pay | Admitting: Family Medicine

## 2020-01-23 ENCOUNTER — Other Ambulatory Visit: Payer: Self-pay | Admitting: Physician Assistant

## 2020-02-05 ENCOUNTER — Ambulatory Visit: Payer: 59 | Attending: Internal Medicine

## 2020-02-05 DIAGNOSIS — Z23 Encounter for immunization: Secondary | ICD-10-CM

## 2020-02-05 NOTE — Progress Notes (Signed)
   Covid-19 Vaccination Clinic  Name:  Jeffery Nielsen    MRN: [id number] DOB: Mar 30, 1966  02/05/2020  [name] was observed post Covid-19 immunization for 15 minutes without incident. He was provided with Vaccine Information Sheet and instruction to access the V-Safe system.   [name] was instructed to call 911 with any severe reactions post vaccine: Marland Kitchen Difficulty breathing  . Swelling of face and throat  . A fast heartbeat  . A bad rash all over body  . Dizziness and weakness   Immunizations Administered    Name Date Dose VIS Date Route   Pfizer COVID-19 Vaccine 02/05/2020 12:30 PM 0.3 mL 11/06/2019 Intramuscular   Manufacturer: [employer], [employer]   Lot: [id number]   NDC: [address]-0

## 2020-02-29 ENCOUNTER — Ambulatory Visit: Payer: 59 | Attending: Internal Medicine

## 2020-02-29 ENCOUNTER — Ambulatory Visit: Payer: 59

## 2020-02-29 DIAGNOSIS — Z23 Encounter for immunization: Secondary | ICD-10-CM

## 2020-02-29 NOTE — Progress Notes (Signed)
   Covid-19 Vaccination Clinic  Name:  Jeffery Nielsen    MRN: [id number] DOB: 1965-12-09  02/29/2020  Jeffery Nielsen was observed post Covid-19 immunization for 15 minutes without incident. He was provided with Vaccine Information Sheet and instruction to access the V-Safe system.   Jeffery Nielsen was instructed to call 911 with any severe reactions post vaccine: Marland Kitchen Difficulty breathing  . Swelling of face and throat  . A fast heartbeat  . A bad rash all over body  . Dizziness and weakness   Immunizations Administered    Name Date Dose VIS Date Route   Pfizer COVID-19 Vaccine 02/29/2020  8:29 AM 0.3 mL 11/06/2019 Intramuscular   Manufacturer: [employer], [employer]   Lot: [id number]   NDC: [address]-8

## 2020-03-13 ENCOUNTER — Other Ambulatory Visit: Payer: Self-pay | Admitting: Family Medicine

## 2020-03-17 ENCOUNTER — Encounter (HOSPITAL_COMMUNITY): Payer: Self-pay | Admitting: Emergency Medicine

## 2020-03-17 ENCOUNTER — Other Ambulatory Visit: Payer: Self-pay

## 2020-03-17 ENCOUNTER — Emergency Department (HOSPITAL_COMMUNITY): Payer: 59

## 2020-03-17 ENCOUNTER — Inpatient Hospital Stay (HOSPITAL_COMMUNITY)
Admission: EM | Admit: 2020-03-17 | Discharge: 2020-03-20 | DRG: 310 | Disposition: A | Discharge status: Home / Self Care | Payer: 59 | Attending: Internal Medicine | Admitting: Internal Medicine

## 2020-03-17 DIAGNOSIS — Z6837 Body mass index (BMI) 37.0-37.9, adult: Secondary | ICD-10-CM

## 2020-03-17 DIAGNOSIS — I4891 Unspecified atrial fibrillation: Secondary | ICD-10-CM | POA: Diagnosis not present

## 2020-03-17 DIAGNOSIS — Z7982 Long term (current) use of aspirin: Secondary | ICD-10-CM

## 2020-03-17 DIAGNOSIS — I480 Paroxysmal atrial fibrillation: Secondary | ICD-10-CM | POA: Diagnosis not present

## 2020-03-17 DIAGNOSIS — E669 Obesity, unspecified: Secondary | ICD-10-CM | POA: Diagnosis present

## 2020-03-17 DIAGNOSIS — Z9119 Patient's noncompliance with other medical treatment and regimen: Secondary | ICD-10-CM

## 2020-03-17 DIAGNOSIS — G4733 Obstructive sleep apnea (adult) (pediatric): Secondary | ICD-10-CM | POA: Diagnosis present

## 2020-03-17 DIAGNOSIS — Z20822 Contact with and (suspected) exposure to covid-19: Secondary | ICD-10-CM | POA: Diagnosis present

## 2020-03-17 DIAGNOSIS — I10 Essential (primary) hypertension: Secondary | ICD-10-CM | POA: Diagnosis present

## 2020-03-17 DIAGNOSIS — Z825 Family history of asthma and other chronic lower respiratory diseases: Secondary | ICD-10-CM

## 2020-03-17 DIAGNOSIS — Z8249 Family history of ischemic heart disease and other diseases of the circulatory system: Secondary | ICD-10-CM

## 2020-03-17 DIAGNOSIS — Z880 Allergy status to penicillin: Secondary | ICD-10-CM

## 2020-03-17 LAB — CBC WITH DIFFERENTIAL/PLATELET
Abs Immature Granulocytes: 0.02 10*3/uL (ref 0.00–0.07)
Basophils Absolute: 0.1 10*3/uL (ref 0.0–0.1)
Basophils Relative: 1 %
Eosinophils Absolute: 0.1 10*3/uL (ref 0.0–0.5)
Eosinophils Relative: 1 %
HCT: 49.4 % (ref 39.0–52.0)
Hemoglobin: 16.6 g/dL (ref 13.0–17.0)
Immature Granulocytes: 0 %
Lymphocytes Relative: 38 %
Lymphs Abs: 3.6 10*3/uL (ref 0.7–4.0)
MCH: 30.9 pg (ref 26.0–34.0)
MCHC: 33.6 g/dL (ref 30.0–36.0)
MCV: 92 fL (ref 80.0–100.0)
Monocytes Absolute: 0.7 10*3/uL (ref 0.1–1.0)
Monocytes Relative: 7 %
Neutro Abs: 5 10*3/uL (ref 1.7–7.7)
Neutrophils Relative %: 53 %
Platelets: 283 10*3/uL (ref 150–400)
RBC: 5.37 MIL/uL (ref 4.22–5.81)
RDW: 12.3 % (ref 11.5–15.5)
WBC: 9.4 10*3/uL (ref 4.0–10.5)
nRBC: 0 % (ref 0.0–0.2)

## 2020-03-17 MED ORDER — SODIUM CHLORIDE 0.9% FLUSH
3.0000 mL | Freq: Once | INTRAVENOUS | Status: AC
  Administered 2020-03-17: 3 mL via INTRAVENOUS

## 2020-03-17 MED ORDER — DILTIAZEM LOAD VIA INFUSION
20.0000 mg | Freq: Once | INTRAVENOUS | Status: AC
  Administered 2020-03-18: 20 mg via INTRAVENOUS
  Filled 2020-03-17: qty 20

## 2020-03-17 MED ORDER — DILTIAZEM HCL-DEXTROSE 125-5 MG/125ML-% IV SOLN (PREMIX)
5.0000 mg/h | INTRAVENOUS | Status: DC
  Administered 2020-03-17: 5 mg/h via INTRAVENOUS
  Administered 2020-03-18 (×2): 10 mg/h via INTRAVENOUS
  Filled 2020-03-17 (×3): qty 125

## 2020-03-17 NOTE — ED Triage Notes (Signed)
Patient states his heart is racing and his heart machine showed it was in afib.
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
## 2020-03-17 NOTE — ED Notes (Signed)
Pt lying in bed awake. Full monitor placed. Pt denies any needs. MD to see pt. Will continue to monitor.

## 2020-03-18 ENCOUNTER — Observation Stay (HOSPITAL_COMMUNITY): Payer: 59

## 2020-03-18 ENCOUNTER — Encounter (HOSPITAL_COMMUNITY): Payer: Self-pay | Admitting: Internal Medicine

## 2020-03-18 DIAGNOSIS — Z9119 Patient's noncompliance with other medical treatment and regimen: Secondary | ICD-10-CM | POA: Diagnosis not present

## 2020-03-18 DIAGNOSIS — G4733 Obstructive sleep apnea (adult) (pediatric): Secondary | ICD-10-CM | POA: Diagnosis present

## 2020-03-18 DIAGNOSIS — I4891 Unspecified atrial fibrillation: Secondary | ICD-10-CM

## 2020-03-18 DIAGNOSIS — Z880 Allergy status to penicillin: Secondary | ICD-10-CM | POA: Diagnosis not present

## 2020-03-18 DIAGNOSIS — Z825 Family history of asthma and other chronic lower respiratory diseases: Secondary | ICD-10-CM | POA: Diagnosis not present

## 2020-03-18 DIAGNOSIS — Z7982 Long term (current) use of aspirin: Secondary | ICD-10-CM | POA: Diagnosis not present

## 2020-03-18 DIAGNOSIS — I480 Paroxysmal atrial fibrillation: Secondary | ICD-10-CM | POA: Diagnosis present

## 2020-03-18 DIAGNOSIS — Z6837 Body mass index (BMI) 37.0-37.9, adult: Secondary | ICD-10-CM | POA: Diagnosis not present

## 2020-03-18 DIAGNOSIS — I10 Essential (primary) hypertension: Secondary | ICD-10-CM | POA: Diagnosis present

## 2020-03-18 DIAGNOSIS — E669 Obesity, unspecified: Secondary | ICD-10-CM | POA: Diagnosis present

## 2020-03-18 DIAGNOSIS — Z8249 Family history of ischemic heart disease and other diseases of the circulatory system: Secondary | ICD-10-CM | POA: Diagnosis not present

## 2020-03-18 DIAGNOSIS — Z20822 Contact with and (suspected) exposure to covid-19: Secondary | ICD-10-CM | POA: Diagnosis present

## 2020-03-18 LAB — BASIC METABOLIC PANEL
Anion gap: 9 (ref 5–15)
Anion gap: 10 (ref 5–15)
BUN: 16 mg/dL (ref 6–20)
BUN: 17 mg/dL (ref 6–20)
CO2: 25 mmol/L (ref 22–32)
CO2: 26 mmol/L (ref 22–32)
Calcium: 9.4 mg/dL (ref 8.9–10.3)
Calcium: 9.1 mg/dL (ref 8.9–10.3)
Chloride: 103 mmol/L (ref 98–111)
Chloride: 103 mmol/L (ref 98–111)
Creatinine, Ser: 1.15 mg/dL (ref 0.61–1.24)
Creatinine, Ser: 1.21 mg/dL (ref 0.61–1.24)
GFR calc Af Amer: >60 mL/min (ref >60)
GFR calc Af Amer: >60 mL/min (ref >60)
GFR calc non Af Amer: >60 mL/min (ref >60)
GFR calc non Af Amer: >60 mL/min (ref >60)
Glucose, Bld: 186 mg/dL — ABNORMAL HIGH (ref 70–99)
Glucose, Bld: 119 mg/dL — ABNORMAL HIGH (ref 70–99)
Potassium: 3.7 mmol/L (ref 3.5–5.1)
Potassium: 4.2 mmol/L (ref 3.5–5.1)
Sodium: 137 mmol/L (ref 135–145)
Sodium: 139 mmol/L (ref 135–145)

## 2020-03-18 LAB — LIPID PANEL
Cholesterol: 151 mg/dL (ref 0–200)
HDL: 38 mg/dL — ABNORMAL LOW (ref >40)
LDL Cholesterol: 98 mg/dL (ref 0–99)
Total CHOL/HDL Ratio: 4 RATIO
Triglycerides: 73 mg/dL (ref <150)
VLDL: 15 mg/dL (ref 0–40)

## 2020-03-18 LAB — TSH: TSH: 1.705 u[IU]/mL (ref 0.350–4.500)

## 2020-03-18 LAB — CBC
HCT: 47.5 % (ref 39.0–52.0)
Hemoglobin: 15.9 g/dL (ref 13.0–17.0)
MCH: 30.8 pg (ref 26.0–34.0)
MCHC: 33.5 g/dL (ref 30.0–36.0)
MCV: 92.1 fL (ref 80.0–100.0)
Platelets: 292 10*3/uL (ref 150–400)
RBC: 5.16 MIL/uL (ref 4.22–5.81)
RDW: 12.5 % (ref 11.5–15.5)
WBC: 8.6 10*3/uL (ref 4.0–10.5)
nRBC: 0 % (ref 0.0–0.2)

## 2020-03-18 LAB — RESPIRATORY PANEL BY RT PCR (FLU A&B, COVID)
Influenza A by PCR: NEGATIVE
Influenza B by PCR: NEGATIVE
SARS Coronavirus 2 by RT PCR: NEGATIVE

## 2020-03-18 LAB — MRSA PCR SCREENING: MRSA by PCR: NEGATIVE

## 2020-03-18 LAB — ECHOCARDIOGRAM COMPLETE
Height: 71 in
Weight: 4320 oz

## 2020-03-18 LAB — HIV ANTIBODY (ROUTINE TESTING W REFLEX): HIV Screen 4th Generation wRfx: NONREACTIVE

## 2020-03-18 LAB — TROPONIN I (HIGH SENSITIVITY): Troponin I (High Sensitivity): 8 ng/L (ref <18)

## 2020-03-18 MED ORDER — ASPIRIN EC 81 MG PO TBEC
81.0000 mg | DELAYED_RELEASE_TABLET | Freq: Every day | ORAL | Status: DC
  Administered 2020-03-18: 81 mg via ORAL
  Filled 2020-03-18: qty 1

## 2020-03-18 MED ORDER — ORAL CARE MOUTH RINSE
15.0000 mL | Freq: Two times a day (BID) | OROMUCOSAL | Status: DC
  Administered 2020-03-18 – 2020-03-20 (×4): 15 mL via OROMUCOSAL

## 2020-03-18 MED ORDER — METOPROLOL SUCCINATE ER 100 MG PO TB24
100.0000 mg | ORAL_TABLET | Freq: Two times a day (BID) | ORAL | Status: DC
  Administered 2020-03-18 – 2020-03-20 (×5): 100 mg via ORAL
  Filled 2020-03-18 (×2): qty 4
  Filled 2020-03-18 (×2): qty 1
  Filled 2020-03-18: qty 4

## 2020-03-18 MED ORDER — ONDANSETRON HCL 4 MG/2ML IJ SOLN: 4.0000 mg | Freq: Four times a day (QID) | INTRAMUSCULAR | Status: DC | PRN

## 2020-03-18 MED ORDER — CHLORHEXIDINE GLUCONATE CLOTH 2 % EX PADS
6.0000 | MEDICATED_PAD | Freq: Every day | CUTANEOUS | Status: DC
  Administered 2020-03-18 – 2020-03-19 (×2): 6 via TOPICAL

## 2020-03-18 MED ORDER — RIVAROXABAN 20 MG PO TABS
20.0000 mg | ORAL_TABLET | Freq: Every day | ORAL | Status: DC
  Administered 2020-03-18 – 2020-03-19 (×2): 20 mg via ORAL
  Filled 2020-03-18 (×2): qty 1

## 2020-03-18 MED ORDER — DRONEDARONE HCL 400 MG PO TABS
400.0000 mg | ORAL_TABLET | Freq: Two times a day (BID) | ORAL | Status: DC
  Administered 2020-03-18: 400 mg via ORAL
  Filled 2020-03-18 (×2): qty 1

## 2020-03-18 MED ORDER — OMEGA-3-ACID ETHYL ESTERS 1 G PO CAPS
1.0000 g | ORAL_CAPSULE | Freq: Every day | ORAL | Status: DC
  Administered 2020-03-18 – 2020-03-20 (×3): 1 g via ORAL
  Filled 2020-03-18 (×3): qty 1

## 2020-03-18 MED ORDER — HYDROCHLOROTHIAZIDE 12.5 MG PO CAPS
12.5000 mg | ORAL_CAPSULE | Freq: Every day | ORAL | Status: DC
  Administered 2020-03-18 – 2020-03-20 (×3): 12.5 mg via ORAL
  Filled 2020-03-18 (×3): qty 1

## 2020-03-18 MED ORDER — ONDANSETRON HCL 4 MG PO TABS: 4.0000 mg | ORAL_TABLET | Freq: Four times a day (QID) | ORAL | Status: DC | PRN

## 2020-03-18 MED ORDER — ENOXAPARIN SODIUM 60 MG/0.6ML ~~LOC~~ SOLN
60.0000 mg | SUBCUTANEOUS | Status: DC
  Administered 2020-03-18: 60 mg via SUBCUTANEOUS
  Filled 2020-03-18: qty 0.6

## 2020-03-18 MED ORDER — ENALAPRIL MALEATE 10 MG PO TABS
20.0000 mg | ORAL_TABLET | Freq: Two times a day (BID) | ORAL | Status: DC
  Administered 2020-03-18 – 2020-03-20 (×5): 20 mg via ORAL
  Filled 2020-03-18 (×5): qty 2
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
## 2020-03-18 NOTE — ED Notes (Signed)
Pt lying in bed on left side. NAD noted. Full monitor on. Will continue to monitor pt.

## 2020-03-18 NOTE — Plan of Care (Signed)
  Problem: Education: Goal: Knowledge of disease or condition will improve Outcome: Progressing Goal: Understanding of medication regimen will improve Outcome: Progressing Goal: Individualized Educational Video(s) Outcome: Progressing   

## 2020-03-18 NOTE — Progress Notes (Signed)
Lovenox per Pharmacy for DVT Prophylaxis    Pharmacy has been consulted from dosing enoxaparin (lovenox) in this patient for DVT prophylaxis.  The pharmacist has reviewed pertinent labs (Hgb _16.6__; PLT_283__), patient weight (_122__kg) and renal function (CrCl_>90__mL/min) and decided that enoxaparin _60_mg SQ Q24Hrs is appropriate for this patient.  The pharmacy department will sign off at this time.  Please reconsult pharmacy if status changes or for further issues.  Thank you  Luetta Nutting PharmD, BCPS  03/18/2020, 3:16 AM

## 2020-03-18 NOTE — ED Notes (Signed)
Pt lying in bed awake. Denies any needs. Will continue to monitor pt.  

## 2020-03-18 NOTE — ED Notes (Signed)
Pt lying in bed awake. Denies any needs. Pt ready to go to the floor.
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
## 2020-03-18 NOTE — Progress Notes (Signed)
pMN admission for afib RVR. See H&P for full details. Echo: Left ventricular ejection fraction, by estimation, is 55 to 60%. The left ventricle has normal function. The left ventricle has no regional wall motion abnormalities. No vegetations or thrombi mentioned in report. He is feeling better this AM. Still on dilt gtt when examined. He admits to missing his medication doses at least twice a week. Cards onboard. Appreciate their assistance. Continue to wean dilt. Cards rec transition to xarelto and stopping multaq. Look to perform cardioversion in 4 weeks. Remainder as per H&P.   A. fib with RVR Hypertension History of sleep apnea Medical non-compliance  Physical Exam: General: 54 y.o. male resting in bed in NAD Cardiovascular: irregular, +S1, S2, no m/g/r, equal pulses throughout Respiratory: CTABL, no w/r/r, normal WOB GI: BS+, NDNT, no masses noted, no organomegaly noted MSK: No e/c/c Neuro: A&O x 3, no focal deficits Psyc: Appropriate interaction and affect, calm/cooperative  .Teddy Spike, DO

## 2020-03-18 NOTE — Progress Notes (Signed)
Echocardiogram 2D Echocardiogram has been performed.  Jeffery Nielsen 03/18/2020, 1:53 PM
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
## 2020-03-19 LAB — CBC WITH DIFFERENTIAL/PLATELET
Abs Immature Granulocytes: 0.01 10*3/uL (ref 0.00–0.07)
Basophils Absolute: 0 10*3/uL (ref 0.0–0.1)
Basophils Relative: 1 %
Eosinophils Absolute: 0.1 10*3/uL (ref 0.0–0.5)
Eosinophils Relative: 2 %
HCT: 48.8 % (ref 39.0–52.0)
Hemoglobin: 16.2 g/dL (ref 13.0–17.0)
Immature Granulocytes: 0 %
Lymphocytes Relative: 47 %
Lymphs Abs: 3.8 10*3/uL (ref 0.7–4.0)
MCH: 30.3 pg (ref 26.0–34.0)
MCHC: 33.2 g/dL (ref 30.0–36.0)
MCV: 91.4 fL (ref 80.0–100.0)
Monocytes Absolute: 0.7 10*3/uL (ref 0.1–1.0)
Monocytes Relative: 8 %
Neutro Abs: 3.4 10*3/uL (ref 1.7–7.7)
Neutrophils Relative %: 42 %
Platelets: 272 10*3/uL (ref 150–400)
RBC: 5.34 MIL/uL (ref 4.22–5.81)
RDW: 12.5 % (ref 11.5–15.5)
WBC: 8.1 10*3/uL (ref 4.0–10.5)
nRBC: 0 % (ref 0.0–0.2)

## 2020-03-19 LAB — RENAL FUNCTION PANEL
Albumin: 3.7 g/dL (ref 3.5–5.0)
Anion gap: 9 (ref 5–15)
BUN: 17 mg/dL (ref 6–20)
CO2: 26 mmol/L (ref 22–32)
Calcium: 8.9 mg/dL (ref 8.9–10.3)
Chloride: 104 mmol/L (ref 98–111)
Creatinine, Ser: 1.14 mg/dL (ref 0.61–1.24)
GFR calc Af Amer: >60 mL/min (ref >60)
GFR calc non Af Amer: >60 mL/min (ref >60)
Glucose, Bld: 128 mg/dL — ABNORMAL HIGH (ref 70–99)
Phosphorus: 4.6 mg/dL (ref 2.5–4.6)
Potassium: 3.9 mmol/L (ref 3.5–5.1)
Sodium: 139 mmol/L (ref 135–145)

## 2020-03-19 LAB — MAGNESIUM: Magnesium: 1.9 mg/dL (ref 1.7–2.4)

## 2020-03-19 MED ORDER — DILTIAZEM HCL ER COATED BEADS 240 MG PO CP24
240.0000 mg | ORAL_CAPSULE | Freq: Every day | ORAL | Status: DC
  Administered 2020-03-19 – 2020-03-20 (×2): 240 mg via ORAL
  Filled 2020-03-19: qty 1
  Filled 2020-03-19: qty 2

## 2020-03-19 NOTE — Discharge Instructions (Signed)
Information on my medicine - XARELTO (Rivaroxaban)  This medication education was reviewed with me or my healthcare representative as part of my discharge preparation.  The pharmacist that spoke with me during my hospital stay was:  Danylle Ouk A, RPH  Why was Xarelto prescribed for you? Xarelto was prescribed for you to reduce the risk of a blood clot forming that can cause a stroke if you have a medical condition called atrial fibrillation (a type of irregular heartbeat).  What do you need to know about xarelto ? Take your Xarelto ONCE DAILY at the same time every day with your evening meal. If you have difficulty swallowing the tablet whole, you may crush it and mix in applesauce just prior to taking your dose.  Take Xarelto exactly as prescribed by your doctor and DO NOT stop taking Xarelto without talking to the doctor who prescribed the medication.  Stopping without other stroke prevention medication to take the place of Xarelto may increase your risk of developing a clot that causes a stroke.  Refill your prescription before you run out.  After discharge, you should have regular check-up appointments with your healthcare provider that is prescribing your Xarelto.  In the future your dose may need to be changed if your kidney function or weight changes by a significant amount.  What do you do if you miss a dose? If you are taking Xarelto ONCE DAILY and you miss a dose, take it as soon as you remember on the same day then continue your regularly scheduled once daily regimen the next day. Do not take two doses of Xarelto at the same time or on the same day.   Important Safety Information A possible side effect of Xarelto is bleeding. You should call your healthcare provider right away if you experience any of the following: ? Bleeding from an injury or your nose that does not stop. ? Unusual colored urine (red or dark brown) or unusual colored stools (red or black). ? Unusual  bruising for unknown reasons. ? A serious fall or if you hit your head (even if there is no bleeding).  Some medicines may interact with Xarelto and might increase your risk of bleeding while on Xarelto. To help avoid this, consult your healthcare provider or pharmacist prior to using any new prescription or non-prescription medications, including herbals, vitamins, non-steroidal anti-inflammatory drugs (NSAIDs) and supplements.  This website has more information on Xarelto: www.xarelto.com.   

## 2020-03-19 NOTE — Progress Notes (Signed)
[facility]                                                                              Patient Demographics  Jeffery Nielsen, is a 54 y.o. male, DOB - 11/10/1966, JYN:[id number]  Admit date - 03/17/2020   Admitting Physician Eduard ClosArshad N Kakrakandy, MD  Outpatient Primary MD for the patient is Carney Livinghambliss, Marshall L, MD  Outpatient specialists:   LOS - 1  days   Medical records reviewed and are as summarized below:    Chief Complaint  Patient presents with  . Atrial Fibrillation       Brief summary   Patient is a 54 year old male with known history of paroxysmal A. fib, hypertension, OSA, obesity presented to ED after having palpitations on the night of admission.  Patient reported mild shortness of breath, no chest pain.  Reports compliant with his medications.  No fevers or chills.  In ED, patient was noted to be in A. fib with RVR, was started on Cardizem infusion. Covid test negative  Assessment & Plan    Principal Problem:   Atrial fibrillation with RVR (HCC), persistent -Patient was started on IV Cardizem, heart rate currently controlled but still in atrial fibrillation -Cardiology following, recommended weaning off IV Cardizem and continue Toprol. -Continue Xarelto, CHA2DS2-VASc score of 2 -If rate is better controlled, outpatient DCCV if he does not revert to NSR.  Discontinued multaq -2D echo showed EF of 55 to 60%, normal LVEF, no vegetation or thrombi  Active Problems:    HYPERTENSION, BENIGN SYSTEMIC -Currently stable    OSA (obstructive sleep apnea) -Noncompliant with CPAP   Obesity Estimated body mass index is 37.66 kg/m as calculated from the following:   Height as of this encounter: 5\' 11"  (1.803 m).   Weight as of this encounter: 122.5 kg.  Counseled on diet and weight control  Code Status: Full CODE STATUS DVT Prophylaxis: Xarelto Family Communication: Discussed all imaging results, lab results, explained to the patient or  *   Disposition Plan:     Status is: Inpatient  Remains inpatient appropriate because:IV treatments appropriate due to intensity of illness or inability to take PO   Dispo: The patient is from: Home              Anticipated d/c is to: Home              Anticipated d/c date is: 2 days              Patient currently is not medically stable to d/c.   Time Spent in minutes   35 minutes  Procedures:  2D echo  Consultants:   Cardiology  Antimicrobials:   Anti-infectives (From admission, onward)   None          Medications  Scheduled Meds: . Chlorhexidine Gluconate Cloth  6 each Topical Daily  . diltiazem  240 mg Oral Daily  . enalapril  20 mg Oral BID  . hydrochlorothiazide  12.5 mg Oral Daily  . mouth rinse  15 mL Mouth Rinse BID  . metoprolol succinate  100 mg Oral BID  . omega-3 acid ethyl esters  1 g Oral Daily  . rivaroxaban  20 mg Oral Q supper   Continuous Infusions: PRN Meds:.ondansetron **OR** ondansetron (ZOFRAN) IV      Subjective:   Jeffery Nielsen was seen and examined today.  States feels a lot better after her heart rate is now controlled.  However still in atrial fibrillation.  No chest pain.  No dizziness or lightheadedness. Patient denies abdominal pain, N/V/D/C, new weakness, numbess, tingling. No acute events overnight.    Objective:   Vitals:   03/19/20 0400 03/19/20 0841 03/19/20 0854 03/19/20 0948  BP: (!) 116/97  (!) 151/84 136/84  Pulse: 70     Resp: (!) 24     Temp:  97.9 F (36.6 C)    TempSrc:  Oral    SpO2: 95%     Weight:      Height:        Intake/Output Summary (Last 24 hours) at 03/19/2020 1104 Last data filed at 03/19/2020 0400 Gross per 24 hour  Intake 523.13 ml  Output --  Net 523.13 ml     Wt Readings from Last 3 Encounters:  03/17/20 122.5 kg  02/19/19 123.4 kg  12/01/18 133.4 kg     Exam  General: Alert and oriented x 3, NAD  Cardiovascular: S1 S2 auscultated, irregularly irregular  Respiratory:  Clear to auscultation bilaterally, no wheezing, rales or rhonchi  Gastrointestinal: Soft, nontender, nondistended, + bowel sounds  Ext: no pedal edema bilaterally  Neuro: No new deficits  Musculoskeletal: No digital cyanosis, clubbing  Skin: No rashes  Psych: Normal affect and demeanor, alert and oriented x3    Data Reviewed:  I have personally reviewed following labs and imaging studies  Micro Results Recent Results (from the past 240 hour(s))  Respiratory Panel by RT PCR (Flu A&B, Covid) - Nasopharyngeal Swab     Status: None   Collection Time: 03/18/20  1:51 AM   Specimen: Nasopharyngeal Swab  Result Value Ref Range Status   SARS Coronavirus 2 by RT PCR NEGATIVE NEGATIVE Final    Comment: (NOTE) SARS-CoV-2 target nucleic acids are NOT DETECTED. The SARS-CoV-2 RNA is generally detectable in upper respiratoy specimens during the acute phase of infection. The lowest concentration of SARS-CoV-2 viral copies this assay can detect is 131 copies/mL. A negative result does not preclude SARS-Cov-2 infection and should not be used as the sole basis for treatment or other patient management decisions. A negative result may occur with  improper specimen collection/handling, submission of specimen other than nasopharyngeal swab, presence of viral mutation(s) within the areas targeted by this assay, and inadequate number of viral copies (<131 copies/mL). A negative result must be combined with clinical observations, patient history, and epidemiological information. The expected result is Negative. Fact Sheet for Patients:  https://www.moore.com/ Fact Sheet for Healthcare Providers:  https://www.young.biz/ This test is not yet ap proved or cleared by the [place] FDA and  has been authorized for detection and/or diagnosis of SARS-CoV-2 by FDA under an Emergency Use Authorization (EUA). This EUA will remain  in effect (meaning this test can  be used) for the duration of the COVID-19 declaration under Section 564(b)(1) of the Act, 21 U.S.C. section 360bbb-3(b)(1), unless the authorization is terminated or revoked sooner.    Influenza A by PCR NEGATIVE NEGATIVE Final   Influenza B by PCR NEGATIVE NEGATIVE Final    Comment: (NOTE) The Xpert Xpress SARS-CoV-2/FLU/RSV assay is intended as an aid in  the diagnosis of influenza from Nasopharyngeal swab specimens and  should not be used as a sole basis for treatment. Nasal washings and  aspirates are unacceptable for Xpert Xpress SARS-CoV-2/FLU/RSV  testing. Fact Sheet for Patients: PinkCheek.be Fact Sheet for Healthcare Providers: GravelBags.it This test is not yet approved or cleared by the [place] FDA and  has been authorized for detection and/or diagnosis of SARS-CoV-2 by  FDA under an Emergency Use Authorization (EUA). This EUA will remain  in effect (meaning this test can be used) for the duration of the  Covid-19 declaration under Section 564(b)(1) of the Act, 21  U.S.C. section 360bbb-3(b)(1), unless the authorization is  terminated or revoked. Performed at [facility], [place] [address] [place], [place] [address]   MRSA PCR Screening     Status: None   Collection Time: 03/18/20  3:10 AM   Specimen: Nasal Mucosa; Nasopharyngeal  Result Value Ref Range Status   MRSA by PCR NEGATIVE NEGATIVE Final    Comment:        The GeneXpert MRSA Assay (FDA approved for NASAL specimens only), is one component of a comprehensive MRSA colonization surveillance program. It is not intended to diagnose MRSA infection nor to guide or monitor treatment for MRSA infections. Performed at [facility], [place] [address] [place], [place] [address]     Radiology Reports DG Chest 2 View  Result Date: 03/17/2020 CLINICAL DATA:  Atrial fibrillation. EXAM: CHEST - 2 VIEW COMPARISON:   02/17/2018 FINDINGS: The cardiomediastinal contours are normal. Aortic atherosclerosis involving the arch. Pulmonary vasculature is normal. No consolidation, pleural effusion, or pneumothorax. No acute osseous abnormalities are seen. IMPRESSION: 1. No acute findings. 2.  Aortic Atherosclerosis (ICD10-I70.0). Electronically Signed   By: Keith Rake M.D.   On: 03/17/2020 22:59   ECHOCARDIOGRAM COMPLETE  Result Date: 03/18/2020    ECHOCARDIOGRAM REPORT   Patient Name:   Jeffery Nielsen Date of Exam: 03/18/2020 Medical Rec #:  [id number]  Height:       71.0 in Accession #:    [id number] Weight:       270.0 lb Date of Birth:  10-27-1966 BSA:          2.395 m Patient Age:    20 years   BP:           106/68 mmHg Patient Gender: M          HR:           74 bpm. Exam Location:  Inpatient Procedure: 2D Echo, Color Doppler and Cardiac Doppler Indications:    I48.91* Unspecified atrial fibrillation  History:        Patient has prior history of Echocardiogram examinations, most                 recent 03/31/2018. Arrythmias:Atrial Fibrillation; Risk                 Factors:Hypertension and Sleep Apnea.  Sonographer:    Raquel Sarna Senior RDCS Referring Phys: [id number] [place]  1. Left ventricular ejection fraction, by estimation, is 55 to 60%. The left ventricle has normal function. The left ventricle has no regional wall motion abnormalities. Left ventricular diastolic parameters are indeterminate.  2. Right ventricular systolic function is normal. The right ventricular size is normal.  3. Left atrial size was mildly dilated.  4. Right atrial size was mildly dilated.  5. The mitral valve is normal in structure. Trivial mitral valve regurgitation. No evidence of mitral stenosis.  6. The aortic valve is tricuspid. Aortic  valve regurgitation is not visualized. No aortic stenosis is present.  7. The inferior vena cava is normal in size with greater than 50% respiratory variability, suggesting right atrial pressure of 3  mmHg. FINDINGS  Left Ventricle: Left ventricular ejection fraction, by estimation, is 55 to 60%. The left ventricle has normal function. The left ventricle has no regional wall motion abnormalities. The left ventricular internal cavity size was normal in size. There is  no left ventricular hypertrophy. Left ventricular diastolic parameters are indeterminate. Right Ventricle: The right ventricular size is normal. No increase in right ventricular wall thickness. Right ventricular systolic function is normal. Left Atrium: Left atrial size was mildly dilated. Right Atrium: Right atrial size was mildly dilated. Pericardium: There is no evidence of pericardial effusion. Mitral Valve: The mitral valve is normal in structure. There is mild thickening of the mitral valve leaflet(s). There is mild calcification of the mitral valve leaflet(s). Normal mobility of the mitral valve leaflets. Trivial mitral valve regurgitation. No evidence of mitral valve stenosis. Tricuspid Valve: The tricuspid valve is normal in structure. Tricuspid valve regurgitation is not demonstrated. No evidence of tricuspid stenosis. Aortic Valve: The aortic valve is tricuspid. Aortic valve regurgitation is not visualized. No aortic stenosis is present. Pulmonic Valve: The pulmonic valve was normal in structure. Pulmonic valve regurgitation is not visualized. No evidence of pulmonic stenosis. Aorta: The aortic root is normal in size and structure. Venous: The inferior vena cava is normal in size with greater than 50% respiratory variability, suggesting right atrial pressure of 3 mmHg. IAS/Shunts: No atrial level shunt detected by color flow Doppler.  LEFT VENTRICLE PLAX 2D LVIDd:         4.96 cm LVIDs:         2.71 cm LV PW:         1.06 cm LV IVS:        0.93 cm LVOT diam:     1.80 cm LV SV:         44 LV SV Index:   18 LVOT Area:     2.54 cm  RIGHT VENTRICLE RV S prime:     12.50 cm/s TAPSE (M-mode): 1.8 cm LEFT ATRIUM             Index       RIGHT  ATRIUM           Index LA diam:        4.00 cm 1.67 cm/m  RA Area:     15.10 cm LA Vol (A2C):   48.8 ml 20.37 ml/m RA Volume:   37.90 ml  15.82 ml/m LA Vol (A4C):   43.9 ml 18.33 ml/m LA Biplane Vol: 47.2 ml 19.70 ml/m  AORTIC VALVE LVOT Vmax:   96.10 cm/s LVOT Vmean:  62.000 cm/s LVOT VTI:    0.174 m  AORTA Ao Root diam: 3.20 cm Ao Asc diam:  3.00 cm  SHUNTS Systemic VTI:  0.17 m Systemic Diam: 1.80 cm Charlton Haws MD Electronically signed by Charlton Haws MD Signature Date/Time: 03/18/2020/2:04:59 PM    Final     Lab Data:  CBC: Recent Labs  Lab 03/17/20 2320 03/18/20 0441 03/19/20 0258  WBC 9.4 8.6 8.1  NEUTROABS 5.0  --  3.4  HGB 16.6 15.9 16.2  HCT 49.4 47.5 48.8  MCV 92.0 92.1 91.4  PLT 283 292 272   Basic Metabolic Panel: Recent Labs  Lab 03/17/20 2320 03/18/20 0441 03/19/20 0258  NA 137 139 139  K 3.7  4.2 3.9  CL 103 103 104  CO2 25 26 26   GLUCOSE 186* 119* 128*  BUN 16 17 17   CREATININE 1.15 1.21 1.14  CALCIUM 9.4 9.1 8.9  MG  --   --  1.9  PHOS  --   --  4.6   GFR: Estimated Creatinine Clearance: 97.6 mL/min (by C-G formula based on SCr of 1.14 mg/dL). Liver Function Tests: Recent Labs  Lab 03/19/20 0258  ALBUMIN 3.7   No results for input(s): LIPASE, AMYLASE in the last 168 hours. No results for input(s): AMMONIA in the last 168 hours. Coagulation Profile: No results for input(s): INR, PROTIME in the last 168 hours. Cardiac Enzymes: No results for input(s): CKTOTAL, CKMB, CKMBINDEX, TROPONINI in the last 168 hours. BNP (last 3 results) No results for input(s): PROBNP in the last 8760 hours. HbA1C: No results for input(s): HGBA1C in the last 72 hours. CBG: No results for input(s): GLUCAP in the last 168 hours. Lipid Profile: Recent Labs    03/18/20 1542  CHOL 151  HDL 38*  LDLCALC 98  TRIG 73  CHOLHDL 4.0   Thyroid Function Tests: Recent Labs    03/18/20 0411  TSH 1.705   Anemia Panel: No results for input(s): VITAMINB12, FOLATE,  FERRITIN, TIBC, IRON, RETICCTPCT in the last 72 hours. Urine analysis:    Component Value Date/Time   COLORURINE yellow 12/01/2009 0917   APPEARANCEUR Clear 12/01/2009 0917   LABSPEC 1.025 12/01/2009 0917   PHURINE 5.5 12/01/2009 0917   HGBUR negative 12/01/2009 0917   BILIRUBINUR negative 12/01/2009 0917   UROBILINOGEN 0.2 12/01/2009 0917   NITRITE negative 12/01/2009 0917     Yacine Droz M.D. [facility] 03/19/2020, 11:04 AM   Call night coverage person covering after 7pm

## 2020-03-19 NOTE — Progress Notes (Signed)
Progress Note  Patient Name: Jeffery Nielsen Date of Encounter: 03/19/2020  Primary Cardiologist: Olga Millers, MD   Subjective   Denies chest pain or sob.   Inpatient Medications    Scheduled Meds: . Chlorhexidine Gluconate Cloth  6 each Topical Daily  . diltiazem  240 mg Oral Daily  . enalapril  20 mg Oral BID  . hydrochlorothiazide  12.5 mg Oral Daily  . mouth rinse  15 mL Mouth Rinse BID  . metoprolol succinate  100 mg Oral BID  . omega-3 acid ethyl esters  1 g Oral Daily  . rivaroxaban  20 mg Oral Q supper   Continuous Infusions:  PRN Meds: ondansetron **OR** ondansetron (ZOFRAN) IV   Vital Signs    Vitals:   03/19/20 0100 03/19/20 0329 03/19/20 0400 03/19/20 0841  BP: 113/81  (!) 116/97   Pulse: 73  70   Resp: 16  (!) 24   Temp:  97.9 F (36.6 C)  97.9 F (36.6 C)  TempSrc:  Oral  Oral  SpO2: 97%  95%   Weight:      Height:        Intake/Output Summary (Last 24 hours) at 03/19/2020 3382 Last data filed at 03/19/2020 0400 Gross per 24 hour  Intake 523.13 ml  Output -  Net 523.13 ml   Filed Weights   03/17/20 2248  Weight: 122.5 kg    Telemetry    Atrial fib with a controlled VR - Personally Reviewed  ECG    Atrial fib with a controlled VR - Personally Reviewed  Physical Exam   GEN: obese middle aged man, no acute distress.   Neck: No JVD Cardiac: IRIRR, no murmurs, rubs, or gallops.  Respiratory: Clear to auscultation bilaterally. GI: Soft, nontender, non-distended  MS: No edema; No deformity. Neuro:  Nonfocal  Psych: Normal affect   Labs    Chemistry Recent Labs  Lab 03/17/20 2320 03/18/20 0441 03/19/20 0258  NA 137 139 139  K 3.7 4.2 3.9  CL 103 103 104  CO2 25 26 26   GLUCOSE 186* 119* 128*  BUN 16 17 17   CREATININE 1.15 1.21 1.14  CALCIUM 9.4 9.1 8.9  ALBUMIN  --   --  3.7  GFRNONAA >60 >60 >60  GFRAA >60 >60 >60  ANIONGAP 9 10 9      Hematology Recent Labs  Lab 03/17/20 2320 03/18/20 0441 03/19/20 0258  WBC  9.4 8.6 8.1  RBC 5.37 5.16 5.34  HGB 16.6 15.9 16.2  HCT 49.4 47.5 48.8  MCV 92.0 92.1 91.4  MCH 30.9 30.8 30.3  MCHC 33.6 33.5 33.2  RDW 12.3 12.5 12.5  PLT 283 292 272    Cardiac EnzymesNo results for input(s): TROPONINI in the last 168 hours. No results for input(s): TROPIPOC in the last 168 hours.   BNPNo results for input(s): BNP, PROBNP in the last 168 hours.   DDimer No results for input(s): DDIMER in the last 168 hours.   Radiology    DG Chest 2 View  Result Date: 03/17/2020 CLINICAL DATA:  Atrial fibrillation. EXAM: CHEST - 2 VIEW COMPARISON:  02/17/2018 FINDINGS: The cardiomediastinal contours are normal. Aortic atherosclerosis involving the arch. Pulmonary vasculature is normal. No consolidation, pleural effusion, or pneumothorax. No acute osseous abnormalities are seen. IMPRESSION: 1. No acute findings. 2.  Aortic Atherosclerosis (ICD10-I70.0). Electronically Signed   By: 03/21/20 M.D.   On: 03/17/2020 22:59   ECHOCARDIOGRAM COMPLETE  Result Date: 03/18/2020    ECHOCARDIOGRAM REPORT  Patient Name:   Jeffery Nielsen Date of Exam: 03/18/2020 Medical Rec #:  [id number]  Height:       71.0 in Accession #:    [id number] Weight:       270.0 lb Date of Birth:  Jan 12, 1966 BSA:          2.395 m Patient Age:    54 years   BP:           106/68 mmHg Patient Gender: M          HR:           74 bpm. Exam Location:  Inpatient Procedure: 2D Echo, Color Doppler and Cardiac Doppler Indications:    I48.91* Unspecified atrial fibrillation  History:        Patient has prior history of Echocardiogram examinations, most                 recent 03/31/2018. Arrythmias:Atrial Fibrillation; Risk                 Factors:Hypertension and Sleep Apnea.  Sonographer:    Irving Burton Senior RDCS Referring Phys: [id number] CADENCE H FURTH IMPRESSIONS  1. Left ventricular ejection fraction, by estimation, is 55 to 60%. The left ventricle has normal function. The left ventricle has no regional wall motion abnormalities. Left  ventricular diastolic parameters are indeterminate.  2. Right ventricular systolic function is normal. The right ventricular size is normal.  3. Left atrial size was mildly dilated.  4. Right atrial size was mildly dilated.  5. The mitral valve is normal in structure. Trivial mitral valve regurgitation. No evidence of mitral stenosis.  6. The aortic valve is tricuspid. Aortic valve regurgitation is not visualized. No aortic stenosis is present.  7. The inferior vena cava is normal in size with greater than 50% respiratory variability, suggesting right atrial pressure of 3 mmHg. FINDINGS  Left Ventricle: Left ventricular ejection fraction, by estimation, is 55 to 60%. The left ventricle has normal function. The left ventricle has no regional wall motion abnormalities. The left ventricular internal cavity size was normal in size. There is  no left ventricular hypertrophy. Left ventricular diastolic parameters are indeterminate. Right Ventricle: The right ventricular size is normal. No increase in right ventricular wall thickness. Right ventricular systolic function is normal. Left Atrium: Left atrial size was mildly dilated. Right Atrium: Right atrial size was mildly dilated. Pericardium: There is no evidence of pericardial effusion. Mitral Valve: The mitral valve is normal in structure. There is mild thickening of the mitral valve leaflet(s). There is mild calcification of the mitral valve leaflet(s). Normal mobility of the mitral valve leaflets. Trivial mitral valve regurgitation. No evidence of mitral valve stenosis. Tricuspid Valve: The tricuspid valve is normal in structure. Tricuspid valve regurgitation is not demonstrated. No evidence of tricuspid stenosis. Aortic Valve: The aortic valve is tricuspid. Aortic valve regurgitation is not visualized. No aortic stenosis is present. Pulmonic Valve: The pulmonic valve was normal in structure. Pulmonic valve regurgitation is not visualized. No evidence of pulmonic  stenosis. Aorta: The aortic root is normal in size and structure. Venous: The inferior vena cava is normal in size with greater than 50% respiratory variability, suggesting right atrial pressure of 3 mmHg. IAS/Shunts: No atrial level shunt detected by color flow Doppler.  LEFT VENTRICLE PLAX 2D LVIDd:         4.96 cm LVIDs:         2.71 cm LV PW:         1.06  cm LV IVS:        0.93 cm LVOT diam:     1.80 cm LV SV:         44 LV SV Index:   18 LVOT Area:     2.54 cm  RIGHT VENTRICLE RV S prime:     12.50 cm/s TAPSE (M-mode): 1.8 cm LEFT ATRIUM             Index       RIGHT ATRIUM           Index LA diam:        4.00 cm 1.67 cm/m  RA Area:     15.10 cm LA Vol (A2C):   48.8 ml 20.37 ml/m RA Volume:   37.90 ml  15.82 ml/m LA Vol (A4C):   43.9 ml 18.33 ml/m LA Biplane Vol: 47.2 ml 19.70 ml/m  AORTIC VALVE LVOT Vmax:   96.10 cm/s LVOT Vmean:  62.000 cm/s LVOT VTI:    0.174 m  AORTA Ao Root diam: 3.20 cm Ao Asc diam:  3.00 cm  SHUNTS Systemic VTI:  0.17 m Systemic Diam: 1.80 cm Jenkins Rouge MD Electronically signed by Jenkins Rouge MD Signature Date/Time: 03/18/2020/2:04:59 PM    Final     Cardiac Studies   none  Patient Profile     54 y.o. male admitted with atrial fib and RVR  Assessment & Plan    1. Atrial fib, persistent - his rates are better controlled. We will wean him off of IV cardizem. Continue toprol. If rates are better controlled tomorrow, I think he could be discharged home with outpatient DCCV if he does not revert to NSR on his own. 2. Obesity - his size makes maintenance of NSR more difficult, especially from the perspective of catheter ablation. 3. Sleep apnea - he has been non-compliant with CPAP. He needs to take his CPAP.  4. Coags - with vascular disease, he has a CHADSVASC score of 2 and will need to continue his xarelto.     For questions or updates, please contact [place] Please consult [email or web address] for contact info under Cardiology/STEMI.      Signed, Cristopher Peru, MD  03/19/2020, 9:17 AM  Patient ID: Neysa Hotter, male   DOB: 11/29/1965, 54 y.o.   MRN: [id number]

## 2020-03-20 DIAGNOSIS — G4733 Obstructive sleep apnea (adult) (pediatric): Secondary | ICD-10-CM

## 2020-03-20 DIAGNOSIS — I10 Essential (primary) hypertension: Secondary | ICD-10-CM

## 2020-03-20 LAB — CBC
HCT: 48.4 % (ref 39.0–52.0)
Hemoglobin: 16.1 g/dL (ref 13.0–17.0)
MCH: 30.6 pg (ref 26.0–34.0)
MCHC: 33.3 g/dL (ref 30.0–36.0)
MCV: 92 fL (ref 80.0–100.0)
Platelets: 266 10*3/uL (ref 150–400)
RBC: 5.26 MIL/uL (ref 4.22–5.81)
RDW: 12.4 % (ref 11.5–15.5)
WBC: 6.8 10*3/uL (ref 4.0–10.5)
nRBC: 0 % (ref 0.0–0.2)

## 2020-03-20 LAB — BASIC METABOLIC PANEL
Anion gap: 9 (ref 5–15)
BUN: 17 mg/dL (ref 6–20)
CO2: 25 mmol/L (ref 22–32)
Calcium: 8.7 mg/dL — ABNORMAL LOW (ref 8.9–10.3)
Chloride: 104 mmol/L (ref 98–111)
Creatinine, Ser: 1.01 mg/dL (ref 0.61–1.24)
GFR calc Af Amer: >60 mL/min (ref >60)
GFR calc non Af Amer: >60 mL/min (ref >60)
Glucose, Bld: 111 mg/dL — ABNORMAL HIGH (ref 70–99)
Potassium: 4.2 mmol/L (ref 3.5–5.1)
Sodium: 138 mmol/L (ref 135–145)

## 2020-03-20 MED ORDER — DILTIAZEM HCL ER COATED BEADS 240 MG PO CP24: 240.0000 mg | ORAL_CAPSULE | Freq: Every day | ORAL | 3 refills | Status: DC

## 2020-03-20 MED ORDER — METOPROLOL SUCCINATE ER 100 MG PO TB24: 100.0000 mg | ORAL_TABLET | Freq: Two times a day (BID) | ORAL | 3 refills | Status: DC

## 2020-03-20 MED ORDER — RIVAROXABAN 20 MG PO TABS: 20.0000 mg | ORAL_TABLET | Freq: Every day | ORAL | 3 refills | Status: DC

## 2020-03-20 NOTE — Progress Notes (Signed)
Progress Note  Patient Name: Jeffery Nielsen Date of Encounter: 03/20/2020  Primary Cardiologist: Olga Millers, MD   Subjective   No chest pain or sob.  Inpatient Medications    Scheduled Meds: . diltiazem  240 mg Oral Daily  . enalapril  20 mg Oral BID  . hydrochlorothiazide  12.5 mg Oral Daily  . mouth rinse  15 mL Mouth Rinse BID  . metoprolol succinate  100 mg Oral BID  . omega-3 acid ethyl esters  1 g Oral Daily  . rivaroxaban  20 mg Oral Q supper   Continuous Infusions:  PRN Meds: ondansetron **OR** ondansetron (ZOFRAN) IV   Vital Signs    Vitals:   03/19/20 1550 03/19/20 2042 03/20/20 0031 03/20/20 0628  BP: 136/78 126/77 119/74 129/73  Pulse: 66 (!) 116 79 79  Resp: 16 16 16 18   Temp: 98.6 F (37 C) 97.7 F (36.5 C) 98.2 F (36.8 C) 98 F (36.7 C)  TempSrc: Oral Oral Oral Oral  SpO2: 98% 96% 97% 99%  Weight:      Height:        Intake/Output Summary (Last 24 hours) at 03/20/2020 0933 Last data filed at 03/19/2020 1700 Gross per 24 hour  Intake 240 ml  Output --  Net 240 ml   Filed Weights   03/17/20 2248  Weight: 122.5 kg    Telemetry    nsr - Personally Reviewed  ECG    none - Personally Reviewed  Physical Exam   GEN: No acute distress.   Neck: No JVD Cardiac: RRR, no murmurs, rubs, or gallops.  Respiratory: Clear to auscultation bilaterally. GI: Soft, nontender, non-distended  MS: No edema; No deformity. Neuro:  Nonfocal  Psych: Normal affect   Labs    Chemistry Recent Labs  Lab 03/18/20 0441 03/19/20 0258 03/20/20 0522  NA 139 139 138  K 4.2 3.9 4.2  CL 103 104 104  CO2 26 26 25   GLUCOSE 119* 128* 111*  BUN 17 17 17   CREATININE 1.21 1.14 1.01  CALCIUM 9.1 8.9 8.7*  ALBUMIN  --  3.7  --   GFRNONAA >60 >60 >60  GFRAA >60 >60 >60  ANIONGAP 10 9 9      Hematology Recent Labs  Lab 03/18/20 0441 03/19/20 0258 03/20/20 0522  WBC 8.6 8.1 6.8  RBC 5.16 5.34 5.26  HGB 15.9 16.2 16.1  HCT 47.5 48.8 48.4  MCV 92.1  91.4 92.0  MCH 30.8 30.3 30.6  MCHC 33.5 33.2 33.3  RDW 12.5 12.5 12.4  PLT 292 272 266    Cardiac EnzymesNo results for input(s): TROPONINI in the last 168 hours. No results for input(s): TROPIPOC in the last 168 hours.   BNPNo results for input(s): BNP, PROBNP in the last 168 hours.   DDimer No results for input(s): DDIMER in the last 168 hours.   Radiology    ECHOCARDIOGRAM COMPLETE  Result Date: 03/18/2020    ECHOCARDIOGRAM REPORT   Patient Name:   GARYSON STELLY Date of Exam: 03/18/2020 Medical Rec #:  03/22/20  Height:       71.0 in Accession #:    03/20/2020 Weight:       270.0 lb Date of Birth:  February 05, 1966 BSA:          2.395 m Patient Age:    54 years   BP:           106/68 mmHg Patient Gender: M          HR:  74 bpm. Exam Location:  Inpatient Procedure: 2D Echo, Color Doppler and Cardiac Doppler Indications:    I48.91* Unspecified atrial fibrillation  History:        Patient has prior history of Echocardiogram examinations, most                 recent 03/31/2018. Arrythmias:Atrial Fibrillation; Risk                 Factors:Hypertension and Sleep Apnea.  Sonographer:    Raquel Sarna Senior RDCS Referring Phys: [id number] [place]  1. Left ventricular ejection fraction, by estimation, is 55 to 60%. The left ventricle has normal function. The left ventricle has no regional wall motion abnormalities. Left ventricular diastolic parameters are indeterminate.  2. Right ventricular systolic function is normal. The right ventricular size is normal.  3. Left atrial size was mildly dilated.  4. Right atrial size was mildly dilated.  5. The mitral valve is normal in structure. Trivial mitral valve regurgitation. No evidence of mitral stenosis.  6. The aortic valve is tricuspid. Aortic valve regurgitation is not visualized. No aortic stenosis is present.  7. The inferior vena cava is normal in size with greater than 50% respiratory variability, suggesting right atrial pressure of 3 mmHg.  FINDINGS  Left Ventricle: Left ventricular ejection fraction, by estimation, is 55 to 60%. The left ventricle has normal function. The left ventricle has no regional wall motion abnormalities. The left ventricular internal cavity size was normal in size. There is  no left ventricular hypertrophy. Left ventricular diastolic parameters are indeterminate. Right Ventricle: The right ventricular size is normal. No increase in right ventricular wall thickness. Right ventricular systolic function is normal. Left Atrium: Left atrial size was mildly dilated. Right Atrium: Right atrial size was mildly dilated. Pericardium: There is no evidence of pericardial effusion. Mitral Valve: The mitral valve is normal in structure. There is mild thickening of the mitral valve leaflet(s). There is mild calcification of the mitral valve leaflet(s). Normal mobility of the mitral valve leaflets. Trivial mitral valve regurgitation. No evidence of mitral valve stenosis. Tricuspid Valve: The tricuspid valve is normal in structure. Tricuspid valve regurgitation is not demonstrated. No evidence of tricuspid stenosis. Aortic Valve: The aortic valve is tricuspid. Aortic valve regurgitation is not visualized. No aortic stenosis is present. Pulmonic Valve: The pulmonic valve was normal in structure. Pulmonic valve regurgitation is not visualized. No evidence of pulmonic stenosis. Aorta: The aortic root is normal in size and structure. Venous: The inferior vena cava is normal in size with greater than 50% respiratory variability, suggesting right atrial pressure of 3 mmHg. IAS/Shunts: No atrial level shunt detected by color flow Doppler.  LEFT VENTRICLE PLAX 2D LVIDd:         4.96 cm LVIDs:         2.71 cm LV PW:         1.06 cm LV IVS:        0.93 cm LVOT diam:     1.80 cm LV SV:         44 LV SV Index:   18 LVOT Area:     2.54 cm  RIGHT VENTRICLE RV S prime:     12.50 cm/s TAPSE (M-mode): 1.8 cm LEFT ATRIUM             Index       RIGHT ATRIUM            Index LA diam:  4.00 cm 1.67 cm/m  RA Area:     15.10 cm LA Vol (A2C):   48.8 ml 20.37 ml/m RA Volume:   37.90 ml  15.82 ml/m LA Vol (A4C):   43.9 ml 18.33 ml/m LA Biplane Vol: 47.2 ml 19.70 ml/m  AORTIC VALVE LVOT Vmax:   96.10 cm/s LVOT Vmean:  62.000 cm/s LVOT VTI:    0.174 m  AORTA Ao Root diam: 3.20 cm Ao Asc diam:  3.00 cm  SHUNTS Systemic VTI:  0.17 m Systemic Diam: 1.80 cm Charlton Haws MD Electronically signed by Charlton Haws MD Signature Date/Time: 03/18/2020/2:04:59 PM    Final     Cardiac Studies   none  Patient Profile     54 y.o. male admitted with atrial fib and RVR/Diastolic heart failure   Assessment & Plan    1. Persistent atrial fib - he has reverted back to NSR. Continue current meds with cardizem and metoprolol at DC along with Xarelto. I will refer him for atrial fib ablation.  2. Obesity - he needs to lose 20 lbs. Or more. 3. HTN - his bp is well controlled.  4. Disp. - ok to discharge today.  [facility] will sign off.   Medication Recommendations:  Continue current meds Other recommendations (labs, testing, etc):  none Follow up as an outpatient:  Dr. Campbell Lerner for atrial fib ablation.  For questions or updates, please contact [facility] Please consult [email or web address] for contact info under Cardiology/STEMI.      Signed, Lewayne Bunting, MD  03/20/2020, 9:33 AM  Patient ID: [name], male   DOB: 1966/09/08, 54 y.o.   MRN: [id number]

## 2020-03-20 NOTE — Discharge Summary (Signed)
Physician Discharge Summary   Patient ID: Blanchie Dessertodd Freet MRN: [id number] DOB/AGE: 56-10-1966 56 y.o.  Admit date: 03/17/2020 Discharge date: 03/20/2020  Primary Care Physician:  Carney Livinghambliss, Marshall L, MD   Recommendations for Outpatient Follow-up:  1. Follow up with PCP in 1-2 weeks 2. Patient will follow up in atrial fibrillation clinic, continue Cardizem, metoprolol, placed on Xarelto 3. Dronedarone discontinued  Home Health: None  Equipment/Devices:   Discharge Condition: stable  CODE STATUS: FULL  Diet recommendation: Heart healthy diet   Discharge Diagnoses:    Atrial fibrillation with RVR, persistent . OBESITY . OSA (obstructive sleep apnea) . HYPERTENSION, BENIGN SYSTEMIC   Consults: Cardiology, Dr. Ladona Ridgelaylor    Allergies:   Allergies  Allergen Reactions  . Amoxicillin Anaphylaxis    Did it involve swelling of the face/tongue/throat, SOB, or low BP? Yes Did it involve sudden or severe rash/hives, skin peeling, or any reaction on the inside of your mouth or nose? No Did you need to seek medical attention at a hospital or doctor's office? Yes When did it last happen?childhood  If all above answers are "NO", may proceed with cephalosporin use.      DISCHARGE MEDICATIONS: Allergies as of 03/20/2020      Reactions   Amoxicillin Anaphylaxis   Did it involve swelling of the face/tongue/throat, SOB, or low BP? Yes Did it involve sudden or severe rash/hives, skin peeling, or any reaction on the inside of your mouth or nose? No Did you need to seek medical attention at a hospital or doctor's office? Yes When did it last happen?childhood  If all above answers are "NO", may proceed with cephalosporin use.      Medication List    STOP taking these medications   aspirin EC 81 MG tablet   dronedarone 400 MG tablet Commonly known as: Multaq     TAKE these medications   albuterol 108 (90 Base) MCG/ACT inhaler Commonly known as: ProAir HFA INHALE 2  PUFFS INTO THE LUNGS EVERY 4 HOURS AS NEEDED FOR SHORTNESS OF BREATH. DO NOT USE REGULARLY What changed:   how much to take  how to take this  when to take this  reasons to take this  additional instructions   APPLE CIDER VINEGAR PO Take 1 tablet by mouth in the morning and at bedtime.   diltiazem 240 MG 24 hr capsule Commonly known as: CARDIZEM CD Take 1 capsule (240 mg total) by mouth daily.   enalapril 20 MG tablet Commonly known as: VASOTEC TAKE 1 TABLET BY MOUTH TWICE A DAY   Fish Oil 1000 MG Caps Take 1,000 mg by mouth daily after breakfast.   hydrochlorothiazide 12.5 MG capsule Commonly known as: MICROZIDE TAKE 1 CAPSULE BY MOUTH EVERY DAY What changed: how much to take   metoprolol succinate 100 MG 24 hr tablet Commonly known as: TOPROL-XL Take 1 tablet (100 mg total) by mouth 2 (two) times daily. Please schedule annual appt for refills. [phone]. 1st attempt   rivaroxaban 20 MG Tabs tablet Commonly known as: XARELTO Take 1 tablet (20 mg total) by mouth daily with supper.        Brief H and P: For complete details please refer to admission H and P, but in brief Patient is a 54 year old male with known history of paroxysmal A. fib, hypertension, OSA, obesity presented to ED after having palpitations on the night of admission.  Patient reported mild shortness of breath, no chest pain.  Reports compliant with his medications.  No fevers or  chills.  In ED, patient was noted to be in A. fib with RVR, was started on Cardizem infusion. Covid test negative   Hospital Course:  Atrial fibrillation with RVR ([place]), persistent -Patient was started on IV Cardizem, heart rate now controlled and in sinus rhythm -Cardiology was consulted, patient has been weaned off off IV Cardizem.  Now placed on Cardizem to 40 mg daily, continue metoprolol -Continue Xarelto, CHA2DS2-VASc score of 2 - Discontinued multaq -2D echo showed EF of 55 to 60%, normal LVEF, no vegetation or  thrombi -Follow-up outpatient in A. fib clinic, will be referred for A. fib ablation     HYPERTENSION, BENIGN SYSTEMIC -Currently stable    OSA (obstructive sleep apnea) -Noncompliant with CPAP   Obesity Estimated body mass index is 37.66 kg/m as calculated from the following:   Height as of this encounter: 5\' 11"  (1.803 m).   Weight as of this encounter: 122.5 kg.  Counseled on diet and weight control   Day of Discharge S: Heart rate controlled, currently normal sinus rhythm, no acute complaints.  No chest pain or shortness of breath.  Wants to go home.  BP 129/73 (BP Location: Left Arm)   Pulse 79   Temp 98 F (36.7 C) (Oral)   Resp 18   Ht 5\' 11"  (1.803 m)   Wt 122.5 kg   SpO2 99%   BMI 37.66 kg/m   Physical Exam: General: Alert and awake oriented x3 not in any acute distress. HEENT: anicteric sclera, pupils reactive to light and accommodation CVS: S1-S2 clear no murmur rubs or gallops Chest: clear to auscultation bilaterally, no wheezing rales or rhonchi Abdomen: soft nontender, nondistended, normal bowel sounds Extremities: no cyanosis, clubbing or edema noted bilaterally Neuro: Cranial nerves II-XII intact, no focal neurological deficits    Get Medicines reviewed and adjusted: Please take all your medications with you for your next visit with your Primary MD  Please request your Primary MD to go over all hospital tests and procedure/radiological results at the follow up. Please ask your Primary MD to get all Hospital records sent to his/her office.  If you experience worsening of your admission symptoms, develop shortness of breath, life threatening emergency, suicidal or homicidal thoughts you must seek medical attention immediately by calling 911 or calling your MD immediately  if symptoms less severe.  You must read complete instructions/literature along with all the possible adverse reactions/side effects for all the Medicines you take and that have  been prescribed to you. Take any new Medicines after you have completely understood and accept all the possible adverse reactions/side effects.   Do not drive when taking pain medications.   Do not take more than prescribed Pain, Sleep and Anxiety Medications  Special Instructions: If you have smoked or chewed Tobacco  in the last 2 yrs please stop smoking, stop any regular Alcohol  and or any Recreational drug use.  Wear Seat belts while driving.  Please note  You were cared for by a hospitalist during your hospital stay. Once you are discharged, your primary care physician will handle any further medical issues. Please note that NO REFILLS for any discharge medications will be authorized once you are discharged, as it is imperative that you return to your primary care physician (or establish a relationship with a primary care physician if you do not have one) for your aftercare needs so that they can reassess your need for medications and monitor your lab values.   The results of significant  diagnostics from this hospitalization (including imaging, microbiology, ancillary and laboratory) are listed below for reference.      Procedures/Studies:  DG Chest 2 View  Result Date: 03/17/2020 CLINICAL DATA:  Atrial fibrillation. EXAM: CHEST - 2 VIEW COMPARISON:  02/17/2018 FINDINGS: The cardiomediastinal contours are normal. Aortic atherosclerosis involving the arch. Pulmonary vasculature is normal. No consolidation, pleural effusion, or pneumothorax. No acute osseous abnormalities are seen. IMPRESSION: 1. No acute findings. 2.  Aortic Atherosclerosis (ICD10-I70.0). Electronically Signed   By: Narda Rutherford M.D.   On: 03/17/2020 22:59   ECHOCARDIOGRAM COMPLETE  Result Date: 03/18/2020    ECHOCARDIOGRAM REPORT   Patient Name:   Obed Mcfann Date of Exam: 03/18/2020 Medical Rec #:  [id number]  Height:       71.0 in Accession #:    [id number] Weight:       270.0 lb Date of Birth:  1965/12/25 BSA:           2.395 m Patient Age:    53 years   BP:           106/68 mmHg Patient Gender: M          HR:           74 bpm. Exam Location:  Inpatient Procedure: 2D Echo, Color Doppler and Cardiac Doppler Indications:    I48.91* Unspecified atrial fibrillation  History:        Patient has prior history of Echocardiogram examinations, most                 recent 03/31/2018. Arrythmias:Atrial Fibrillation; Risk                 Factors:Hypertension and Sleep Apnea.  Sonographer:    Irving Burton Senior RDCS Referring Phys: [id number] [place] [facility] IMPRESSIONS  1. Left ventricular ejection fraction, by estimation, is 55 to 60%. The left ventricle has normal function. The left ventricle has no regional wall motion abnormalities. Left ventricular diastolic parameters are indeterminate.  2. Right ventricular systolic function is normal. The right ventricular size is normal.  3. Left atrial size was mildly dilated.  4. Right atrial size was mildly dilated.  5. The mitral valve is normal in structure. Trivial mitral valve regurgitation. No evidence of mitral stenosis.  6. The aortic valve is tricuspid. Aortic valve regurgitation is not visualized. No aortic stenosis is present.  7. The inferior vena cava is normal in size with greater than 50% respiratory variability, suggesting right atrial pressure of 3 mmHg. FINDINGS  Left Ventricle: Left ventricular ejection fraction, by estimation, is 55 to 60%. The left ventricle has normal function. The left ventricle has no regional wall motion abnormalities. The left ventricular internal cavity size was normal in size. There is  no left ventricular hypertrophy. Left ventricular diastolic parameters are indeterminate. Right Ventricle: The right ventricular size is normal. No increase in right ventricular wall thickness. Right ventricular systolic function is normal. Left Atrium: Left atrial size was mildly dilated. Right Atrium: Right atrial size was mildly dilated. Pericardium: There is no evidence of  pericardial effusion. Mitral Valve: The mitral valve is normal in structure. There is mild thickening of the mitral valve leaflet(s). There is mild calcification of the mitral valve leaflet(s). Normal mobility of the mitral valve leaflets. Trivial mitral valve regurgitation. No evidence of mitral valve stenosis. Tricuspid Valve: The tricuspid valve is normal in structure. Tricuspid valve regurgitation is not demonstrated. No evidence of tricuspid stenosis. Aortic Valve: The aortic valve is tricuspid. Aortic valve  regurgitation is not visualized. No aortic stenosis is present. Pulmonic Valve: The pulmonic valve was normal in structure. Pulmonic valve regurgitation is not visualized. No evidence of pulmonic stenosis. Aorta: The aortic root is normal in size and structure. Venous: The inferior vena cava is normal in size with greater than 50% respiratory variability, suggesting right atrial pressure of 3 mmHg. IAS/Shunts: No atrial level shunt detected by color flow Doppler.  LEFT VENTRICLE PLAX 2D LVIDd:         4.96 cm LVIDs:         2.71 cm LV PW:         1.06 cm LV IVS:        0.93 cm LVOT diam:     1.80 cm LV SV:         44 LV SV Index:   18 LVOT Area:     2.54 cm  RIGHT VENTRICLE RV S prime:     12.50 cm/s TAPSE (M-mode): 1.8 cm LEFT ATRIUM             Index       RIGHT ATRIUM           Index LA diam:        4.00 cm 1.67 cm/m  RA Area:     15.10 cm LA Vol (A2C):   48.8 ml 20.37 ml/m RA Volume:   37.90 ml  15.82 ml/m LA Vol (A4C):   43.9 ml 18.33 ml/m LA Biplane Vol: 47.2 ml 19.70 ml/m  AORTIC VALVE LVOT Vmax:   96.10 cm/s LVOT Vmean:  62.000 cm/s LVOT VTI:    0.174 m  AORTA Ao Root diam: 3.20 cm Ao Asc diam:  3.00 cm  SHUNTS Systemic VTI:  0.17 m Systemic Diam: 1.80 cm Charlton Haws MD Electronically signed by Charlton Haws MD Signature Date/Time: 03/18/2020/2:04:59 PM    Final        LAB RESULTS: Basic Metabolic Panel: Recent Labs  Lab 03/19/20 0258 03/20/20 0522  NA 139 138  K 3.9 4.2  CL 104  104  CO2 26 25  GLUCOSE 128* 111*  BUN 17 17  CREATININE 1.14 1.01  CALCIUM 8.9 8.7*  MG 1.9  --   PHOS 4.6  --    Liver Function Tests: Recent Labs  Lab 03/19/20 0258  ALBUMIN 3.7   No results for input(s): LIPASE, AMYLASE in the last 168 hours. No results for input(s): AMMONIA in the last 168 hours. CBC: Recent Labs  Lab 03/19/20 0258 03/19/20 0258 03/20/20 0522  WBC 8.1  --  6.8  NEUTROABS 3.4  --   --   HGB 16.2  --  16.1  HCT 48.8  --  48.4  MCV 91.4   < > 92.0  PLT 272  --  266   < > = values in this interval not displayed.   Cardiac Enzymes: No results for input(s): CKTOTAL, CKMB, CKMBINDEX, TROPONINI in the last 168 hours. BNP: Invalid input(s): POCBNP CBG: No results for input(s): GLUCAP in the last 168 hours.     Disposition and Follow-up: Discharge Instructions    Diet - low sodium heart healthy   Complete by: As directed    Increase activity slowly   Complete by: As directed        DISPOSITION: Home   DISCHARGE FOLLOW-UP Follow-up Information    [facility] Follow up on 03/30/2020.   Specialty: Cardiology Why: at 2:30PM Contact information: [address] [phone] mc  [facility] [place] [address] [phone]       [name], MD. Schedule an appointment as soon as possible for a visit in 2 week(s).   Specialty: Family Medicine Contact information: [address] [place] [address] [phone]        Lewayne Bunting, MD .   Specialty: Cardiology Contact information: [address] [place] [address] [phone]        Marinus Maw, MD .   Specialty: Cardiology Contact information: [phone] [place] [address] [phone]            Time coordinating discharge:  35 minutes  Signed:   Thad Ranger M.D. [facility] 03/20/2020, 12:54 PM

## 2020-03-22 ENCOUNTER — Telehealth: Payer: Self-pay | Admitting: Internal Medicine

## 2020-03-22 NOTE — Telephone Encounter (Signed)
Call returned to Pt.  Advised ok to return to work.  Sent return to work note via Clinical cytogeneticist as requested.

## 2020-03-22 NOTE — Telephone Encounter (Signed)
   Pt said he went to the ED last 04/22 and he's been out of work, he said he needs a note to go back and wondering if Dr. Ladona Ridgel can give it to him  Please advise

## 2020-03-25 ENCOUNTER — Telehealth: Payer: Self-pay

## 2020-03-25 ENCOUNTER — Encounter: Payer: Self-pay | Admitting: Internal Medicine

## 2020-03-25 ENCOUNTER — Telehealth (INDEPENDENT_AMBULATORY_CARE_PROVIDER_SITE_OTHER): Payer: 59 | Admitting: Internal Medicine

## 2020-03-25 DIAGNOSIS — I480 Paroxysmal atrial fibrillation: Secondary | ICD-10-CM | POA: Diagnosis not present

## 2020-03-25 DIAGNOSIS — I10 Essential (primary) hypertension: Secondary | ICD-10-CM | POA: Diagnosis not present

## 2020-03-25 DIAGNOSIS — G4733 Obstructive sleep apnea (adult) (pediatric): Secondary | ICD-10-CM

## 2020-03-25 DIAGNOSIS — Z01810 Encounter for preprocedural cardiovascular examination: Secondary | ICD-10-CM

## 2020-03-25 DIAGNOSIS — I4891 Unspecified atrial fibrillation: Secondary | ICD-10-CM
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
## 2020-03-25 NOTE — Telephone Encounter (Signed)
-----   Message from Hillis Range, MD sent at 03/25/2020 12:33 PM EDT ----- Please help him get started on his cpap   Also,  afib ablation [facility]  Cardiac CT

## 2020-03-28 NOTE — Telephone Encounter (Signed)
Left detailed message for Pt requesting call back.  Also advised message had been sent via MyChart.  Requested call back.

## 2020-03-29 ENCOUNTER — Ambulatory Visit: Payer: 59 | Admitting: Student

## 2020-03-30 ENCOUNTER — Ambulatory Visit (HOSPITAL_COMMUNITY): Payer: 59 | Admitting: Nurse Practitioner

## 2020-03-30 NOTE — Telephone Encounter (Signed)
Outreach made to Pt.  Call went to VM  Requested call back

## 2020-04-04 ENCOUNTER — Telehealth: Payer: Self-pay

## 2020-04-04 NOTE — Telephone Encounter (Signed)
Called and spoke with pt per message for beginning treatment for OSA. Pt states he never had a follow up appt with Dr.kelly after his sleep study and he never went to pick up his machine. Sleep study was on 03/27/18  He states he had forgotten who to contact to pick up the machine from. Notified that per a note in his chart from sleep coordinator the orders for his machine were faxed to [facility].  Able schedule pt for an ov with Dr.Kelly on 04/13/20 at 11:40 to follow up on his previous study. Notified I would send this message to our sleep coordinator as well. Pt verbalized understanding with no other questions at this time.

## 2020-04-04 NOTE — Telephone Encounter (Signed)
Patient returning Jennifer's call.

## 2020-04-04 NOTE — Telephone Encounter (Signed)
Spoke with the pt and he agrees to Ablation 05/10/20 with Covid testing 05/07/20.   Pt given dates but chose to have 05/10/20 even though 3rd case and probable overnight stay.   He verbalized understanding of his instructions.   Labs and CT ordered.

## 2020-04-05 NOTE — Telephone Encounter (Signed)
ok 

## 2020-04-07 NOTE — Addendum Note (Signed)
Addended by: Roney Mans A on: 04/07/2020 08:12 AM   Modules accepted: Orders

## 2020-04-12 ENCOUNTER — Other Ambulatory Visit: Payer: Self-pay | Admitting: Family Medicine

## 2020-04-13 ENCOUNTER — Ambulatory Visit (INDEPENDENT_AMBULATORY_CARE_PROVIDER_SITE_OTHER): Payer: 59 | Admitting: Cardiovascular Disease

## 2020-04-13 ENCOUNTER — Encounter: Payer: Self-pay | Admitting: Cardiovascular Disease

## 2020-04-13 ENCOUNTER — Other Ambulatory Visit: Payer: Self-pay

## 2020-04-13 ENCOUNTER — Telehealth: Payer: Self-pay | Admitting: *Deleted

## 2020-04-13 VITALS — BP 126/82 | HR 72 | Ht 71.0 in | Wt 274.0 lb

## 2020-04-13 DIAGNOSIS — I10 Essential (primary) hypertension: Secondary | ICD-10-CM | POA: Diagnosis not present

## 2020-04-13 DIAGNOSIS — I480 Paroxysmal atrial fibrillation: Secondary | ICD-10-CM

## 2020-04-13 DIAGNOSIS — G4733 Obstructive sleep apnea (adult) (pediatric): Secondary | ICD-10-CM | POA: Diagnosis not present
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
## 2020-04-13 NOTE — Patient Instructions (Signed)
Medication Instructions:  CONTINUE WITH CURRENT MEDICATIONS. NO CHANGES.  *If you need a refill on your cardiac medications before your next appointment, please call your pharmacy*  Testing/Procedures: Your physician has recommended that you have a sleep study. This test records several body functions during sleep, including: brain activity, eye movement, oxygen and carbon dioxide blood levels, heart rate and rhythm, breathing rate and rhythm, the flow of air through your mouth and nose, snoring, body muscle movements, and chest and belly movement.  OUR SLEEP COORDINATOR WILL CALL TO SCHEDULE THIS   Follow-Up: At [facility], you and your health needs are our priority.  As part of our continuing mission to provide you with exceptional heart care, we have created designated Provider Care Teams.  These Care Teams include your primary Cardiologist (physician) and Advanced Practice Providers (APPs -  Physician Assistants and Nurse Practitioners) who all work together to provide you with the care you need, when you need it.  We recommend signing up for the patient portal called "MyChart".  Sign up information is provided on this After Visit Summary.  MyChart is used to connect with patients for Virtual Visits (Telemedicine).  Patients are able to view lab/test results, encounter notes, upcoming appointments, etc.  Non-urgent messages can be sent to your provider as well.   To learn more about what you can do with MyChart, go to [email or web address].    Your next appointment:   3 month(s)  The format for your next appointment:   In Person  Provider:   [name], MD

## 2020-04-13 NOTE — Telephone Encounter (Signed)
-----   Message from June Leap June, RN sent at 04/13/2020  1:26 PM EDT ----- Pt needs to be scheduled for a split night sleep study. Thank you!

## 2020-04-15 ENCOUNTER — Ambulatory Visit: Payer: 59 | Admitting: General Practice

## 2020-05-04 ENCOUNTER — Telehealth (HOSPITAL_COMMUNITY): Payer: Self-pay | Admitting: *Deleted

## 2020-05-04 NOTE — Telephone Encounter (Signed)
Attempted to call patient regarding upcoming cardiac CT appointment. Left message on voicemail with name and callback number  Fatih Stalvey Tai RN Navigator Cardiac Imaging [facility] [phone] Office [phone] Cell  

## 2020-05-05 ENCOUNTER — Other Ambulatory Visit: Payer: Self-pay

## 2020-05-05 ENCOUNTER — Telehealth (HOSPITAL_COMMUNITY): Payer: Self-pay | Admitting: *Deleted

## 2020-05-05 ENCOUNTER — Ambulatory Visit (HOSPITAL_COMMUNITY)
Admission: RE | Admit: 2020-05-05 | Discharge: 2020-05-05 | Disposition: A | Discharge status: Home / Self Care | Payer: 59 | Source: Ambulatory Visit | Attending: Internal Medicine | Admitting: Internal Medicine

## 2020-05-05 DIAGNOSIS — I4891 Unspecified atrial fibrillation: Secondary | ICD-10-CM | POA: Insufficient documentation

## 2020-05-05 MED ORDER — IOHEXOL 350 MG/ML SOLN
80.0000 mL | Freq: Once | INTRAVENOUS | Status: AC | PRN
  Administered 2020-05-05: 90 mL via INTRAVENOUS

## 2020-05-05 NOTE — Telephone Encounter (Signed)
Retuning pt's call concerning upcoming cardiac imaging study; pt verbalizes understanding of appt date/time, parking situation and where to check in, and  pre-test NPO status; name and call back number provided for further questions should they arise  Burley Saver RN Navigator Cardiac [employer] [facility] [phone] office [phone] cell

## 2020-05-07 ENCOUNTER — Other Ambulatory Visit (HOSPITAL_COMMUNITY)
Admission: RE | Admit: 2020-05-07 | Discharge: 2020-05-07 | Disposition: A | Discharge status: Home / Self Care | Payer: 59 | Source: Ambulatory Visit | Attending: Internal Medicine | Admitting: Internal Medicine

## 2020-05-07 DIAGNOSIS — Z01812 Encounter for preprocedural laboratory examination: Secondary | ICD-10-CM | POA: Insufficient documentation

## 2020-05-07 DIAGNOSIS — Z20822 Contact with and (suspected) exposure to covid-19: Secondary | ICD-10-CM | POA: Insufficient documentation

## 2020-05-07 LAB — SARS CORONAVIRUS 2 (TAT 6-24 HRS): SARS Coronavirus 2: NEGATIVE

## 2020-05-10 ENCOUNTER — Encounter (HOSPITAL_COMMUNITY): Payer: Self-pay | Admitting: Internal Medicine

## 2020-05-10 ENCOUNTER — Other Ambulatory Visit: Payer: Self-pay

## 2020-05-10 ENCOUNTER — Other Ambulatory Visit: Payer: Self-pay | Admitting: Family Medicine

## 2020-05-10 ENCOUNTER — Encounter (HOSPITAL_COMMUNITY)
Admission: RE | Disposition: A | Discharge status: Home / Self Care | Payer: 59 | Source: Ambulatory Visit | Attending: Internal Medicine

## 2020-05-10 ENCOUNTER — Ambulatory Visit (HOSPITAL_COMMUNITY): Payer: 59 | Admitting: Certified Registered Nurse Anesthetist

## 2020-05-10 ENCOUNTER — Ambulatory Visit (HOSPITAL_COMMUNITY)
Admission: RE | Admit: 2020-05-10 | Discharge: 2020-05-10 | Disposition: A | Discharge status: Home / Self Care | Payer: 59 | Source: Ambulatory Visit | Attending: Internal Medicine | Admitting: Internal Medicine

## 2020-05-10 DIAGNOSIS — I10 Essential (primary) hypertension: Secondary | ICD-10-CM | POA: Insufficient documentation

## 2020-05-10 DIAGNOSIS — Z79899 Other long term (current) drug therapy: Secondary | ICD-10-CM | POA: Diagnosis not present

## 2020-05-10 DIAGNOSIS — Z8249 Family history of ischemic heart disease and other diseases of the circulatory system: Secondary | ICD-10-CM | POA: Insufficient documentation

## 2020-05-10 DIAGNOSIS — Z825 Family history of asthma and other chronic lower respiratory diseases: Secondary | ICD-10-CM | POA: Insufficient documentation

## 2020-05-10 DIAGNOSIS — Z7901 Long term (current) use of anticoagulants: Secondary | ICD-10-CM | POA: Diagnosis not present

## 2020-05-10 DIAGNOSIS — G4733 Obstructive sleep apnea (adult) (pediatric): Secondary | ICD-10-CM | POA: Insufficient documentation

## 2020-05-10 DIAGNOSIS — J45909 Unspecified asthma, uncomplicated: Secondary | ICD-10-CM | POA: Diagnosis not present

## 2020-05-10 DIAGNOSIS — Z880 Allergy status to penicillin: Secondary | ICD-10-CM | POA: Diagnosis not present

## 2020-05-10 DIAGNOSIS — I480 Paroxysmal atrial fibrillation: Secondary | ICD-10-CM | POA: Diagnosis present

## 2020-05-10 HISTORY — PX: ATRIAL FIBRILLATION ABLATION: EP1191

## 2020-05-10 LAB — CBC
HCT: 43.8 % (ref 39.0–52.0)
Hemoglobin: 14.4 g/dL (ref 13.0–17.0)
MCH: 30.2 pg (ref 26.0–34.0)
MCHC: 32.9 g/dL (ref 30.0–36.0)
MCV: 91.8 fL (ref 80.0–100.0)
Platelets: 260 K/uL (ref 150–400)
RBC: 4.77 MIL/uL (ref 4.22–5.81)
RDW: 12.5 % (ref 11.5–15.5)
WBC: 6 K/uL (ref 4.0–10.5)
nRBC: 0 % (ref 0.0–0.2)

## 2020-05-10 LAB — POCT ACTIVATED CLOTTING TIME
Activated Clotting Time: 252 seconds
Activated Clotting Time: 279 seconds
Activated Clotting Time: 301 seconds

## 2020-05-10 LAB — BASIC METABOLIC PANEL
Anion gap: 7 (ref 5–15)
BUN: 15 mg/dL (ref 6–20)
CO2: 27 mmol/L (ref 22–32)
Calcium: 8.9 mg/dL (ref 8.9–10.3)
Chloride: 103 mmol/L (ref 98–111)
Creatinine, Ser: 1.1 mg/dL (ref 0.61–1.24)
GFR calc Af Amer: >60 mL/min (ref >60)
GFR calc non Af Amer: >60 mL/min (ref >60)
Glucose, Bld: 90 mg/dL (ref 70–99)
Potassium: 4.3 mmol/L (ref 3.5–5.1)
Sodium: 137 mmol/L (ref 135–145)

## 2020-05-10 SURGERY — ATRIAL FIBRILLATION ABLATION
Anesthesia: General

## 2020-05-10 MED ORDER — SODIUM CHLORIDE 0.9% FLUSH: 3.0000 mL | Freq: Two times a day (BID) | INTRAVENOUS | Status: DC

## 2020-05-10 MED ORDER — SUGAMMADEX SODIUM 200 MG/2ML IV SOLN
INTRAVENOUS | Status: DC | PRN
  Administered 2020-05-10: 200 mg via INTRAVENOUS

## 2020-05-10 MED ORDER — ONDANSETRON HCL 4 MG/2ML IJ SOLN
INTRAMUSCULAR | Status: DC | PRN
  Administered 2020-05-10: 4 mg via INTRAVENOUS

## 2020-05-10 MED ORDER — SODIUM CHLORIDE 0.9 % IV SOLN: 250.0000 mL | INTRAVENOUS | Status: DC | PRN

## 2020-05-10 MED ORDER — FENTANYL CITRATE (PF) 100 MCG/2ML IJ SOLN
INTRAMUSCULAR | Status: DC | PRN
  Administered 2020-05-10 (×2): 50 ug via INTRAVENOUS

## 2020-05-10 MED ORDER — ISOPROTERENOL HCL 0.2 MG/ML IJ SOLN
INTRAMUSCULAR | Status: AC
  Filled 2020-05-10: qty 5

## 2020-05-10 MED ORDER — HEPARIN SODIUM (PORCINE) 1000 UNIT/ML IJ SOLN
INTRAMUSCULAR | Status: AC
  Filled 2020-05-10: qty 2

## 2020-05-10 MED ORDER — HEPARIN SODIUM (PORCINE) 1000 UNIT/ML IJ SOLN
INTRAMUSCULAR | Status: DC | PRN
Start: 2020-05-10 — End: 2020-05-10
  Administered 2020-05-10: 4000 [IU] via INTRAVENOUS
  Administered 2020-05-10 (×2): 5000 [IU] via INTRAVENOUS

## 2020-05-10 MED ORDER — PROTAMINE SULFATE 10 MG/ML IV SOLN
INTRAVENOUS | Status: DC | PRN
Start: 2020-05-10 — End: 2020-05-10
  Administered 2020-05-10: 40 mg via INTRAVENOUS

## 2020-05-10 MED ORDER — PANTOPRAZOLE SODIUM 40 MG PO TBEC
40.0000 mg | DELAYED_RELEASE_TABLET | Freq: Every day | ORAL | 0 refills | Status: DC
Start: 2020-05-10 — End: 2020-06-03

## 2020-05-10 MED ORDER — PROPOFOL 10 MG/ML IV BOLUS
INTRAVENOUS | Status: DC | PRN
  Administered 2020-05-10: 150 mg via INTRAVENOUS
  Administered 2020-05-10: 50 mg via INTRAVENOUS

## 2020-05-10 MED ORDER — MIDAZOLAM HCL 2 MG/2ML IJ SOLN
INTRAMUSCULAR | Status: DC | PRN
  Administered 2020-05-10: 2 mg via INTRAVENOUS

## 2020-05-10 MED ORDER — HEPARIN (PORCINE) IN NACL 1000-0.9 UT/500ML-% IV SOLN
INTRAVENOUS | Status: DC | PRN
  Administered 2020-05-10: 500 mL

## 2020-05-10 MED ORDER — SODIUM CHLORIDE 0.9% FLUSH: 3.0000 mL | INTRAVENOUS | Status: DC | PRN

## 2020-05-10 MED ORDER — HEPARIN (PORCINE) IN NACL 1000-0.9 UT/500ML-% IV SOLN
INTRAVENOUS | Status: AC
  Filled 2020-05-10: qty 500

## 2020-05-10 MED ORDER — LIDOCAINE 2% (20 MG/ML) 5 ML SYRINGE
INTRAMUSCULAR | Status: DC | PRN
  Administered 2020-05-10: 30 mg via INTRAVENOUS

## 2020-05-10 MED ORDER — DEXAMETHASONE SODIUM PHOSPHATE 10 MG/ML IJ SOLN
INTRAMUSCULAR | Status: DC | PRN
  Administered 2020-05-10: 10 mg via INTRAVENOUS

## 2020-05-10 MED ORDER — ISOPROTERENOL HCL 0.2 MG/ML IJ SOLN
INTRAVENOUS | Status: DC | PRN
  Administered 2020-05-10: 20 ug/min via INTRAVENOUS

## 2020-05-10 MED ORDER — HEPARIN SODIUM (PORCINE) 1000 UNIT/ML IJ SOLN
INTRAMUSCULAR | Status: DC | PRN
  Administered 2020-05-10: 14000 [IU] via INTRAVENOUS
  Administered 2020-05-10: 1000 [IU] via INTRAVENOUS

## 2020-05-10 MED ORDER — SODIUM CHLORIDE 0.9 % IV SOLN: INTRAVENOUS | Status: DC

## 2020-05-10 MED ORDER — ROCURONIUM BROMIDE 10 MG/ML (PF) SYRINGE
PREFILLED_SYRINGE | INTRAVENOUS | Status: DC | PRN
  Administered 2020-05-10: 60 mg via INTRAVENOUS

## 2020-05-10 SURGICAL SUPPLY — 17 items
BLANKET WARM UNDERBOD FULL ACC (MISCELLANEOUS) ×2 IMPLANT
CATH MAPPNG PENTARAY F 2-6-2MM (CATHETERS) ×1 IMPLANT
CATH SMTCH THERMOCOOL SF DF (CATHETERS) ×2 IMPLANT
CATH SOUNDSTAR ECO 8FR (CATHETERS) ×2 IMPLANT
CATH WEBSTER BI DIR CS D-F CRV (CATHETERS) ×2 IMPLANT
COVER SWIFTLINK CONNECTOR (BAG) ×2 IMPLANT
DEVICE CLOSURE PERCLS PRGLD 6F (VASCULAR PRODUCTS) ×3 IMPLANT
NEEDLE BAYLIS TRANSSEPTAL 71CM (NEEDLE) ×2 IMPLANT
PACK EP LATEX FREE (CUSTOM PROCEDURE TRAY) ×2
PACK EP LF (CUSTOM PROCEDURE TRAY) ×1 IMPLANT
PAD PRO RADIOLUCENT 2001M-C (PAD) ×2 IMPLANT
PENTARAY F 2-6-2MM (CATHETERS) ×2
PERCLOSE PROGLIDE 6F (VASCULAR PRODUCTS) ×6
SHEATH PINNACLE 7F 10CM (SHEATH) ×4 IMPLANT
SHEATH PINNACLE 9F 10CM (SHEATH) ×2 IMPLANT
SHEATH PROBE COVER 6X72 (BAG) ×2 IMPLANT
SHEATH SWARTZ TS SL2 63CM 8.5F (SHEATH) ×2 IMPLANT

## 2020-05-10 NOTE — Anesthesia Preprocedure Evaluation (Addendum)
Anesthesia Evaluation  Patient identified by MRN, date of birth, ID band Patient awake    Reviewed: Allergy & Precautions, NPO status , Patient's Chart, lab work & pertinent test results, reviewed documented beta blocker date and time   History of Anesthesia Complications Negative for: history of anesthetic complications  Airway Mallampati: I  TM Distance: >3 FB Neck ROM: Full    Dental  (+) Dental Advisory Given   Pulmonary sleep apnea (does not use CPAP) , COPD,  COPD inhaler,  05/07/2020 SARS coronavirus neg   breath sounds clear to auscultation       Cardiovascular hypertension, Pt. on medications and Pt. on home beta blockers (-) angina Rhythm:Irregular Rate:Normal  02/2020 ECHO: EF 55-60%, valves OK   Neuro/Psych negative neurological ROS     GI/Hepatic Neg liver ROS, GERD  Controlled,  Endo/Other  Morbid obesity  Renal/GU negative Renal ROS     Musculoskeletal   Abdominal (+) + obese,   Peds  Hematology xarelto   Anesthesia Other Findings   Reproductive/Obstetrics                            Anesthesia Physical Anesthesia Plan  ASA: III  Anesthesia Plan: General   Post-op Pain Management:    Induction: Intravenous  PONV Risk Score and Plan: 2 and Ondansetron and Dexamethasone  Airway Management Planned: Oral ETT  Additional Equipment:   Intra-op Plan:   Post-operative Plan: Extubation in OR  Informed Consent: I have reviewed the patients History and Physical, chart, labs and discussed the procedure including the risks, benefits and alternatives for the proposed anesthesia with the patient or authorized representative who has indicated his/her understanding and acceptance.     Dental advisory given  Plan Discussed with: CRNA and Surgeon  Anesthesia Plan Comments:        Anesthesia Quick Evaluation

## 2020-05-10 NOTE — Anesthesia Postprocedure Evaluation (Signed)
Anesthesia Post Note  Patient: Jeffery Nielsen  Procedure(s) Performed: ATRIAL FIBRILLATION ABLATION (N/A )     Patient location during evaluation: Cath Lab Anesthesia Type: General Level of consciousness: awake and alert Pain management: pain level controlled Vital Signs Assessment: post-procedure vital signs reviewed and stable Respiratory status: spontaneous breathing, nonlabored ventilation and respiratory function stable Cardiovascular status: blood pressure returned to baseline and stable Postop Assessment: no apparent nausea or vomiting Anesthetic complications: no   No complications documented.  Last Vitals:  Vitals:   05/10/20 1635 05/10/20 1650  BP: 133/62 (!) 143/79  Pulse: 81 82  Resp: 15 16  Temp:  36.4 C  SpO2: 100% 100%    Last Pain:  Vitals:   05/10/20 1650  TempSrc:   PainSc: 0-No pain                 Lautaro Koral,W. EDMOND

## 2020-05-10 NOTE — Anesthesia Procedure Notes (Signed)
Procedure Name: Intubation Date/Time: 05/10/2020 1:23 PM Performed by: Genelle Bal, CRNA Pre-anesthesia Checklist: Patient identified, Emergency Drugs available, Suction available and Patient being monitored Patient Re-evaluated:Patient Re-evaluated prior to induction Oxygen Delivery Method: Circle system utilized Preoxygenation: Pre-oxygenation with 100% oxygen Induction Type: IV induction Ventilation: Oral airway inserted - appropriate to patient size and Two handed mask ventilation required Laryngoscope Size: Mac and 4 Grade View: Grade II Tube type: Oral Tube size: 7.5 mm Number of attempts: 1 Airway Equipment and Method: Stylet and Oral airway Placement Confirmation: ETT inserted through vocal cords under direct vision,  positive ETCO2 and breath sounds checked- equal and bilateral Secured at: 24 cm Tube secured with: Tape Dental Injury: Teeth and Oropharynx as per pre-operative assessment

## 2020-05-10 NOTE — Transfer of Care (Signed)
Immediate Anesthesia Transfer of Care Note  Patient: Jeffery Nielsen  Procedure(s) Performed: ATRIAL FIBRILLATION ABLATION (N/A )  Patient Location: Cath Lab  Anesthesia Type:General  Level of Consciousness: awake  Airway & Oxygen Therapy: Patient Spontanous Breathing and Patient connected to nasal cannula oxygen  Post-op Assessment: Report given to RN  Post vital signs: Reviewed and stable  Last Vitals:  Vitals Value Taken Time  BP 137/59 05/10/20 1621  Temp 36.3 C 05/10/20 1621  Pulse 81 05/10/20 1629  Resp 14 05/10/20 1629  SpO2 100 % 05/10/20 1629  Vitals shown include unvalidated device data.  Last Pain:  Vitals:   05/10/20 1621  TempSrc: Temporal  PainSc: Asleep         Complications: No complications documented.

## 2020-05-10 NOTE — Discharge Instructions (Signed)
Post procedure care instructions No driving for 4 days. No lifting over 5 lbs for 1 week. No vigorous or sexual activity for 1 week. You may return to work/your usual activities on 05/17/2020. Keep procedure site clean & dry. If you notice increased pain, swelling, bleeding or pus, call/return!  You may shower, but no soaking baths/hot tubs/pools for 1 week.    You have an appointment set up with the Atrial Fibrillation Clinic.  Multiple studies have shown that being followed by a dedicated atrial fibrillation clinic in addition to the standard care you receive from your other physicians improves health. We believe that enrollment in the atrial fibrillation clinic will allow [place] to better care for you.   The phone number to the Atrial Fibrillation Clinic is [phone]. The clinic is staffed Monday through Friday from 8:30am to 5pm.  Parking Directions: The clinic is located in the Heart and Vascular Building connected to [facility]. 1)From [address] turn on to [address] and go to the 3rd entrance  (Heart and Vascular entrance) on the right. 2)Look to the right for Heart &Vascular Parking Garage. 3)A code for the entrance is required please call the clinic to receive this.   4)Take the elevators to the 1st floor. Registration is in the room with the glass walls at the end of the hallway.  If you have any trouble parking or locating the clinic, please don't hesitate to call [phone].

## 2020-05-10 NOTE — Progress Notes (Signed)
Patient and wife was given discharge instructions. Both verbalized understanding. 
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
## 2020-05-11 ENCOUNTER — Encounter (HOSPITAL_COMMUNITY): Payer: Self-pay | Admitting: Internal Medicine

## 2020-05-17 ENCOUNTER — Telehealth: Payer: Self-pay | Admitting: Internal Medicine

## 2020-05-17 NOTE — Telephone Encounter (Signed)
New Message:  Pt had an Ablation 05-10-20. He said he need a letter to return to work today please. Please this letter to [phone] Att: Aliene Beams

## 2020-05-17 NOTE — Telephone Encounter (Signed)
Return to work letter faxed as requested

## 2020-06-03 ENCOUNTER — Other Ambulatory Visit: Payer: Self-pay | Admitting: Internal Medicine

## 2020-06-07 ENCOUNTER — Encounter (HOSPITAL_COMMUNITY): Payer: Self-pay

## 2020-06-07 ENCOUNTER — Inpatient Hospital Stay (HOSPITAL_COMMUNITY): Admission: RE | Admit: 2020-06-07 | Payer: 59 | Source: Ambulatory Visit | Admitting: Nurse Practitioner

## 2020-06-14 ENCOUNTER — Other Ambulatory Visit: Payer: Self-pay | Admitting: Family Medicine

## 2020-07-07 IMAGING — CT CT HEART MORPH/PULM VEIN W/ CM & W/O CA SCORE
3 of 6 series · 10 of 20 positions shown, 11 images · IV contrast (APPLIED)
Comparison: None.
COMPARISON: None.

Addendum:
EXAM:
OVER-READ INTERPRETATION  CT CHEST

The following report is an over-read performed by radiologist Dr.
Unondjamo Litabula [REDACTED] on 05/05/2020. This
over-read does not include interpretation of cardiac or coronary
anatomy or pathology. The coronary calcium score/coronary CTA
interpretation by the cardiologist is attached.
CLINICAL DATA: 53M with atrial fibrillation scheduled for an
ablation.
Cardiac CT/CTA
TECHNIQUE: The patient was scanned on a Siemens Somatom scanner.

[Series 6: pv delay · axial · portal-venous · 0.39mm/px · z∈[-51,-20]mm · 2 of 184 slices shown]
[im 62/184  vessel]
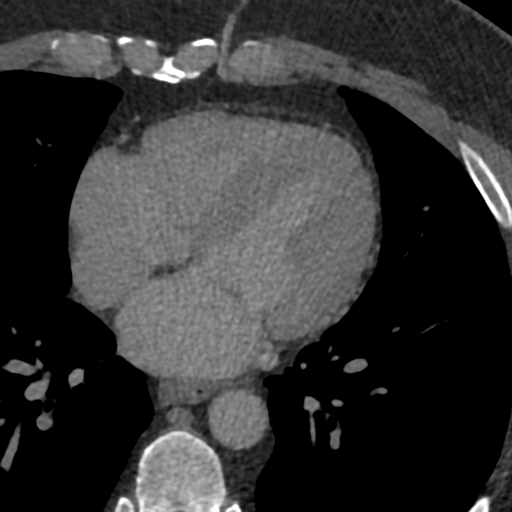
[im 123/184  vessel]
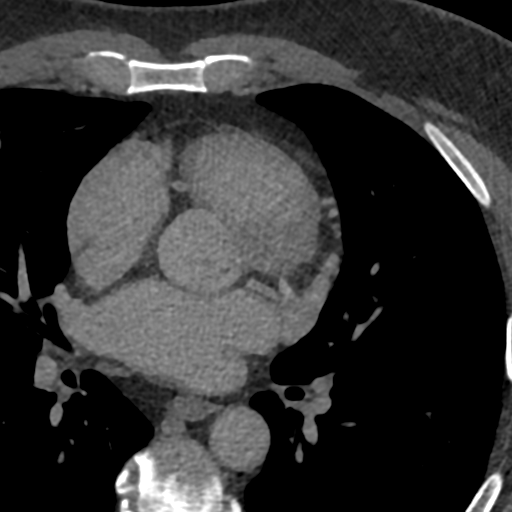

[Series 7: best diast · axial · 0.39mm/px · z∈[-85,-12]mm · 4 of 305 slices shown, 5 images]
[im 61/305  vessel]
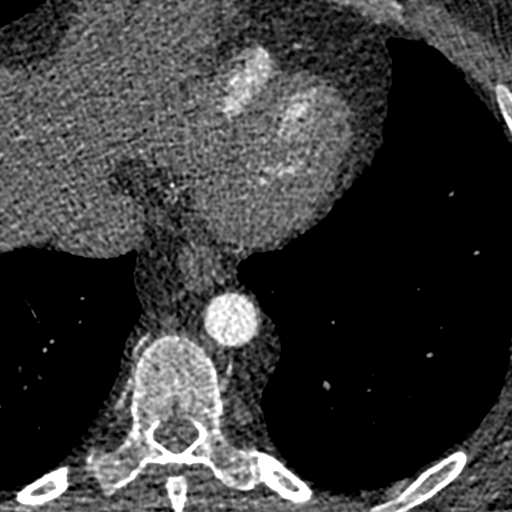
[im 61/305  lung]
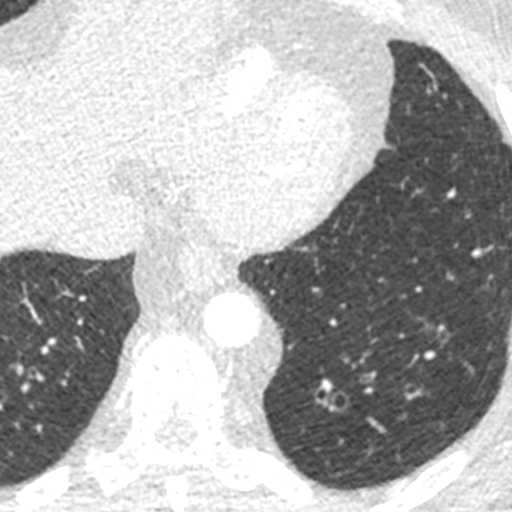
[im 122/305  vessel]
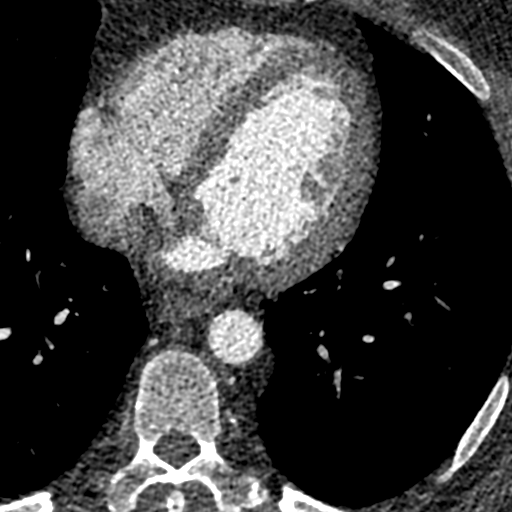
[im 183/305  vessel]
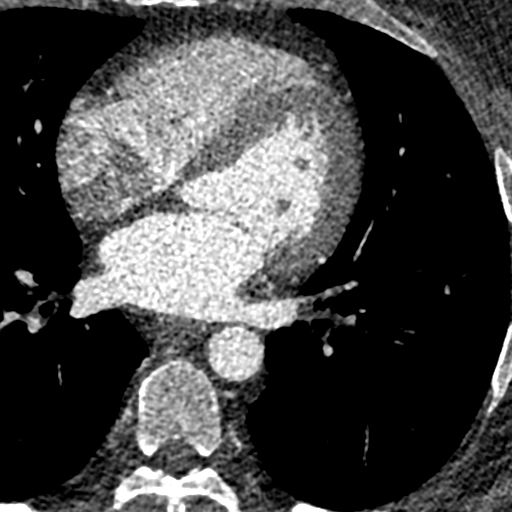
[im 244/305  vessel]
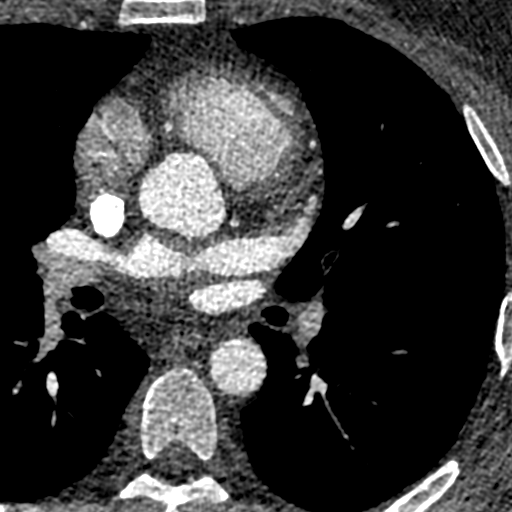

[Series 8: best syst · axial · 0.39mm/px · z∈[-85,-12]mm · 4 of 305 slices shown]
[im 61/305  vessel]
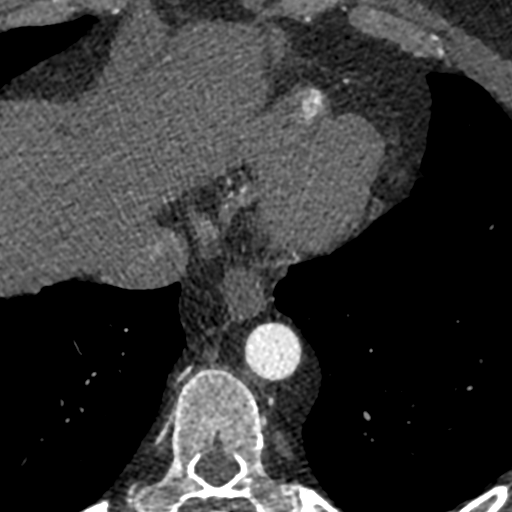
[im 122/305  vessel]
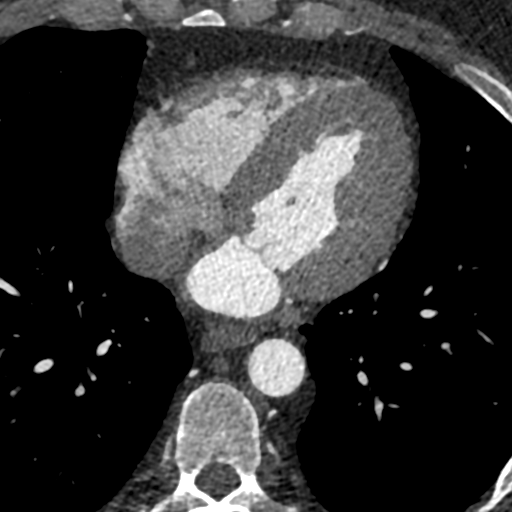
[im 183/305  vessel]
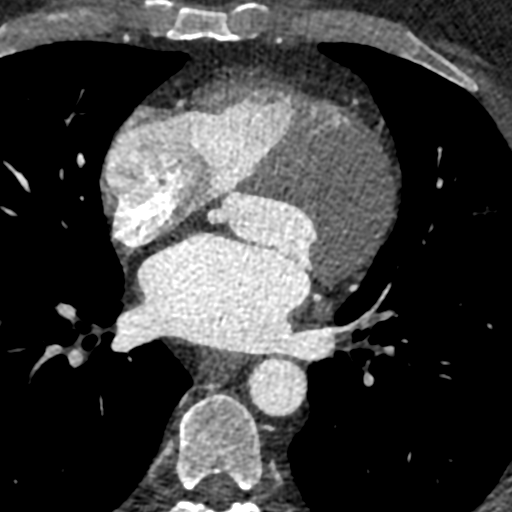
[im 244/305  vessel]
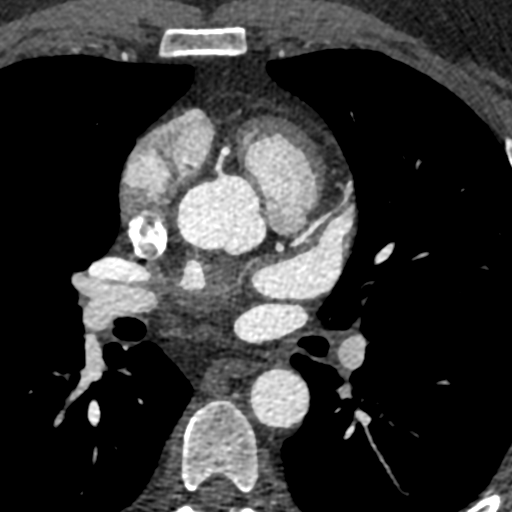

[10 of 20 positions shown; findings below may reference images not displayed]

FINDINGS: Within the visualized portions of the thorax there are no suspicious
appearing pulmonary nodules or masses, there is no acute
consolidative airspace disease, no pleural effusions, no
pneumothorax and no lymphadenopathy. Visualized portions of the
upper abdomen demonstrates a ovoid shaped 1.9 x 0.9 cm
low-attenuation lesion in segment 2 of the liver, compatible with a
simple cyst. There are no aggressive appearing lytic or blastic
lesions noted in the visualized portions of the skeleton.
IMPRESSION: 1. No significant incidental noncardiac findings are noted.
FINDINGS: A 120 kV prospective scan was triggered in the descending thoracic
aorta at 111 HU's. Gantry rotation speed was 280 msecs and
collimation was .9 mm. No beta blockade and no NTG was given. The 3D
data set was reconstructed in 5% intervals of the 60-80 % of the R-R
cycle. Diastolic phases were analyzed on a dedicated work station
using MPR, MIP and VRT modes. The patient received 80 cc of
contrast.

There is normal pulmonary vein drainage into the left atrium (2 on
the right and 2 on the left) with ostial measurements as follows:

RUPV: 16.3 mm x 16.9 mm

RLPV: 14.7 mm x 12.5 mm

LUPV: 19.8 mm x 12.1 mm

LLPV: 20.9 mm x 11.1 mm

The left atrial appendage is large windsock shape and ostial size
23.6 X 15.6 mm and length 40 mm. There is no thrombus in the left
atrial appendage.

The esophagus runs in the left atrial midline and is not in the
proximity to any of the pulmonary veins.

Aorta: Normal caliber. Ascending aorta 2.9 cm. No dissection or
calcifications.

Aortic Valve:  Trileaflet.  No calcifications.

Coronary Arteries: Normal coronary origin. Right dominance. The
study was performed without use of NTG and insufficient for plaque
evaluation. No coronary calcification noted.
IMPRESSION: 1. There is normal pulmonary vein drainage into the left atrium.

2. The left atrial appendage is large windsock shape and ostial size
23.6 X 15.6 mm and length 40 mm. There is no thrombus in the left
atrial appendage.

3. The esophagus runs in the left atrial midline and is not in the
proximity to any of the pulmonary veins.

4.  Coronary calcium score 0.

*** End of Addendum ***
EXAM:
OVER-READ INTERPRETATION  CT CHEST

The following report is an over-read performed by radiologist Dr.
Unondjamo Litabula [REDACTED] on 05/05/2020. This
over-read does not include interpretation of cardiac or coronary
anatomy or pathology. The coronary calcium score/coronary CTA
interpretation by the cardiologist is attached.
FINDINGS: Within the visualized portions of the thorax there are no suspicious
appearing pulmonary nodules or masses, there is no acute
consolidative airspace disease, no pleural effusions, no
pneumothorax and no lymphadenopathy. Visualized portions of the
upper abdomen demonstrates a ovoid shaped 1.9 x 0.9 cm
low-attenuation lesion in segment 2 of the liver, compatible with a
simple cyst. There are no aggressive appearing lytic or blastic
lesions noted in the visualized portions of the skeleton.
IMPRESSION: 1. No significant incidental noncardiac findings are noted.

## 2020-07-11 ENCOUNTER — Other Ambulatory Visit: Payer: Self-pay | Admitting: Family Medicine
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
## 2020-07-15 ENCOUNTER — Telehealth: Payer: Self-pay | Admitting: Internal Medicine

## 2020-07-15 NOTE — Telephone Encounter (Signed)
Left message for pt to take once a day - by mouth - 2 hours before or 2 hr after a meal.  Requested he c/b if any questions.

## 2020-07-15 NOTE — Telephone Encounter (Signed)
Patient states he is requesting clarification on how he is to take pantoprazole (PROTONIX) 40 MG tablet medication. Please advise.

## 2020-07-15 NOTE — Telephone Encounter (Signed)
Transferred call to Pam  

## 2020-07-18 ENCOUNTER — Encounter: Payer: Self-pay | Admitting: Cardiology

## 2020-07-18 ENCOUNTER — Ambulatory Visit: Payer: 59 | Admitting: Cardiology

## 2020-07-18 ENCOUNTER — Other Ambulatory Visit: Payer: Self-pay

## 2020-07-18 VITALS — BP 134/88 | HR 66 | Temp 97.9°F | Ht 71.0 in | Wt 278.0 lb

## 2020-07-18 DIAGNOSIS — I10 Essential (primary) hypertension: Secondary | ICD-10-CM | POA: Diagnosis not present

## 2020-07-18 DIAGNOSIS — G4733 Obstructive sleep apnea (adult) (pediatric): Secondary | ICD-10-CM | POA: Diagnosis not present

## 2020-07-18 DIAGNOSIS — I480 Paroxysmal atrial fibrillation: Secondary | ICD-10-CM

## 2020-07-18 NOTE — Patient Instructions (Signed)

## 2020-07-21 ENCOUNTER — Other Ambulatory Visit: Payer: Self-pay | Admitting: Family Medicine

## 2020-07-25 ENCOUNTER — Telehealth: Payer: Self-pay | Admitting: Cardiology

## 2020-07-25 ENCOUNTER — Other Ambulatory Visit: Payer: Self-pay | Admitting: Family Medicine

## 2020-07-25 DIAGNOSIS — I480 Paroxysmal atrial fibrillation: Secondary | ICD-10-CM

## 2020-07-25 MED ORDER — RIVAROXABAN 20 MG PO TABS: 20.0000 mg | ORAL_TABLET | Freq: Every day | ORAL | 3 refills | Status: DC

## 2020-07-25 NOTE — Telephone Encounter (Signed)
Pt c/o medication issue:  1. Name of Medication: rivaroxaban (XARELTO) 20 MG TABS tablet  2. How are you currently taking this medication (dosage and times per day)? 1 tablet a day  3. Are you having a reaction (difficulty breathing--STAT)? no  4. What is your medication issue? Patient states he is supposed to stay on the medication, but he has been having trouble getting it.

## 2020-07-25 NOTE — Telephone Encounter (Signed)
Pt states that he has been having issues getting his Xarelto refilled because the original Rx was written by the MD seeing him in the hospital and the pharmacy could not get in contact with the physician. Will send to Dr. [name] to request refill.

## 2020-07-25 NOTE — Telephone Encounter (Signed)
Patient was returning phone call 

## 2020-07-27 ENCOUNTER — Other Ambulatory Visit: Payer: Self-pay | Admitting: Cardiology

## 2020-07-27 ENCOUNTER — Other Ambulatory Visit: Payer: Self-pay | Admitting: Internal Medicine

## 2020-07-27 NOTE — Telephone Encounter (Signed)
This is Dr. Crenshaw's pt 

## 2020-08-08 ENCOUNTER — Ambulatory Visit (INDEPENDENT_AMBULATORY_CARE_PROVIDER_SITE_OTHER): Payer: 59 | Admitting: Internal Medicine

## 2020-08-08 ENCOUNTER — Other Ambulatory Visit: Payer: Self-pay

## 2020-08-08 ENCOUNTER — Encounter: Payer: Self-pay | Admitting: Internal Medicine

## 2020-08-08 VITALS — BP 128/74 | HR 73 | Ht 71.0 in | Wt 279.4 lb

## 2020-08-08 DIAGNOSIS — G4733 Obstructive sleep apnea (adult) (pediatric): Secondary | ICD-10-CM

## 2020-08-08 DIAGNOSIS — I480 Paroxysmal atrial fibrillation: Secondary | ICD-10-CM | POA: Diagnosis not present

## 2020-08-08 DIAGNOSIS — I10 Essential (primary) hypertension: Secondary | ICD-10-CM | POA: Diagnosis not present

## 2020-08-08 MED ORDER — METOPROLOL TARTRATE 100 MG PO TABS
100.0000 mg | ORAL_TABLET | Freq: Every day | ORAL | 3 refills | Status: DC
Start: 2020-08-08 — End: 2020-11-17
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
## 2020-08-08 NOTE — Patient Instructions (Addendum)
Medication Instructions:  1 Stop Protonix 2 Reduce Toprol-XL 100 mg to one tablet by mouth daily  *If you need a refill on your cardiac medications before your next appointment, please call your pharmacy*  Lab Work: None ordered.  If you have labs (blood work) drawn today and your tests are completely normal, you will receive your results only by: Marland Kitchen MyChart Message (if you have MyChart) OR . A paper copy in the mail If you have any lab test that is abnormal or we need to change your treatment, we will call you to review the results.  Testing/Procedures: None ordered.  Follow-Up: At [facility], you and your health needs are our priority.  As part of our continuing mission to provide you with exceptional heart care, we have created designated Provider Care Teams.  These Care Teams include your primary Cardiologist (physician) and Advanced Practice Providers (APPs -  Physician Assistants and Nurse Practitioners) who all work together to provide you with the care you need, when you need it.  We recommend signing up for the patient portal called "MyChart".  Sign up information is provided on this After Visit Summary.  MyChart is used to connect with patients for Virtual Visits (Telemedicine).  Patients are able to view lab/test results, encounter notes, upcoming appointments, etc.  Non-urgent messages can be sent to your provider as well.   To learn more about what you can do with MyChart, go to [email or web address].    Your next appointment:   Your physician wants you to follow-up in: 11/09/20 at 9:15 am with Dr. [name]  Other Instructions:

## 2020-08-21 ENCOUNTER — Other Ambulatory Visit: Payer: Self-pay | Admitting: Family Medicine

## 2020-09-04 ENCOUNTER — Other Ambulatory Visit: Payer: Self-pay | Admitting: Family Medicine

## 2020-10-19 ENCOUNTER — Other Ambulatory Visit: Payer: Self-pay | Admitting: Family Medicine

## 2020-11-09 ENCOUNTER — Ambulatory Visit: Payer: 59 | Admitting: Internal Medicine

## 2020-11-17 ENCOUNTER — Other Ambulatory Visit: Payer: Self-pay

## 2020-11-17 ENCOUNTER — Ambulatory Visit: Payer: 59 | Admitting: Internal Medicine

## 2020-11-17 ENCOUNTER — Ambulatory Visit (INDEPENDENT_AMBULATORY_CARE_PROVIDER_SITE_OTHER): Payer: 59 | Admitting: Internal Medicine

## 2020-11-17 ENCOUNTER — Encounter: Payer: Self-pay | Admitting: Internal Medicine

## 2020-11-17 VITALS — BP 124/80 | HR 70 | Ht 67.0 in | Wt 287.0 lb

## 2020-11-17 DIAGNOSIS — I480 Paroxysmal atrial fibrillation: Secondary | ICD-10-CM | POA: Diagnosis not present

## 2020-11-17 DIAGNOSIS — I10 Essential (primary) hypertension: Secondary | ICD-10-CM

## 2020-11-17 DIAGNOSIS — G4733 Obstructive sleep apnea (adult) (pediatric): Secondary | ICD-10-CM

## 2020-11-17 MED ORDER — METOPROLOL TARTRATE 100 MG PO TABS: 50.0000 mg | ORAL_TABLET | Freq: Every day | ORAL | 0 refills | Status: DC

## 2020-11-17 MED ORDER — METOPROLOL SUCCINATE ER 25 MG PO TB24: 25.0000 mg | ORAL_TABLET | Freq: Every day | ORAL | 3 refills | Status: DC

## 2020-11-17 NOTE — Patient Instructions (Addendum)
Medication Instructions:  Your physician has recommended you make the following change in your medication:   1.  REDUCE your metoprolol 100 mg-  TAke 1/2 tablet (50 mg) by mouth daily until December 27, 2020 then STOP  2.  On December 28, 2020 START taking metoprolol succinate 25 mg- One tablet by mouth daily  Labwork: None ordered.  Testing/Procedures: None ordered.  Follow-Up: Your physician wants you to follow-up in: 4 months with Dr. [name].     March 06, 2021 at 8:00 am at the [facility] office   Any Other Special Instructions Will Be Listed Below (If Applicable).  If you need a refill on your cardiac medications before your next appointment, please call your pharmacy.
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
## 2020-11-20 ENCOUNTER — Other Ambulatory Visit: Payer: Self-pay | Admitting: Internal Medicine

## 2020-11-21 ENCOUNTER — Other Ambulatory Visit: Payer: Self-pay | Admitting: Family Medicine

## 2020-11-21 NOTE — Telephone Encounter (Signed)
Prescription refill request for Xarelto received.   Indication: Afib Last office visit: [facility], 11/17/2020 Weight: 130.2 kg  Age: 54 yo Scr: 1.10 05/10/2020 CrCl: 138 ml/min   Prescription refill sent.

## 2020-12-21 ENCOUNTER — Other Ambulatory Visit: Payer: Self-pay | Admitting: Family Medicine

## 2021-01-04 ENCOUNTER — Ambulatory Visit (INDEPENDENT_AMBULATORY_CARE_PROVIDER_SITE_OTHER): Payer: 59 | Admitting: Family Medicine

## 2021-01-04 ENCOUNTER — Other Ambulatory Visit: Payer: Self-pay

## 2021-01-04 ENCOUNTER — Encounter: Payer: Self-pay | Admitting: Family Medicine

## 2021-01-04 DIAGNOSIS — G4733 Obstructive sleep apnea (adult) (pediatric): Secondary | ICD-10-CM | POA: Diagnosis not present

## 2021-01-04 DIAGNOSIS — I10 Essential (primary) hypertension: Secondary | ICD-10-CM | POA: Diagnosis not present

## 2021-01-04 DIAGNOSIS — M6530 Trigger finger, unspecified finger: Secondary | ICD-10-CM | POA: Insufficient documentation

## 2021-01-04 DIAGNOSIS — M65342 Trigger finger, left ring finger: Secondary | ICD-10-CM

## 2021-01-04 MED ORDER — HYDROCHLOROTHIAZIDE 12.5 MG PO CAPS
12.5000 mg | ORAL_CAPSULE | Freq: Every day | ORAL | 3 refills | Status: DC
Start: 2021-01-04 — End: 2022-01-31

## 2021-01-04 MED ORDER — ENALAPRIL MALEATE 20 MG PO TABS
20.0000 mg | ORAL_TABLET | Freq: Two times a day (BID) | ORAL | 3 refills | Status: DC
Start: 2021-01-04 — End: 2021-08-01

## 2021-01-04 NOTE — Assessment & Plan Note (Signed)
He has been working on cars more recently.  Bothers him most when wakes up.  Suggest cut back on activity and see if improves.  Other wise will refer to hand surgery
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
## 2021-01-04 NOTE — Patient Instructions (Addendum)
Good to see you today!  Thanks for coming in.   Follow up on the sleep apnea - CPAP machine - that would help with weight loss and with blood pressure control  Take all your medications regularly  Continue regular daily exercise for at least 20-30 minutes every day  For the finger - cut back on activities that might cause hand pain or swelling.  If not better in a few months or if worse then call me and will refer you to  Hydrographic surveyor.   Come back in 12 months unless any problems

## 2021-01-04 NOTE — Assessment & Plan Note (Signed)
Strongly encouraged to follow up with CPAP fitting.  Might help with blood pressure and weight loss

## 2021-01-04 NOTE — Assessment & Plan Note (Signed)
Not at goal today because did not take his medications this AM.  Discussed regular adherence
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
## 2021-01-20 ENCOUNTER — Encounter: Payer: Self-pay | Admitting: Cardiology

## 2021-01-20 ENCOUNTER — Ambulatory Visit: Payer: 59 | Admitting: Cardiology

## 2021-01-20 ENCOUNTER — Other Ambulatory Visit: Payer: Self-pay

## 2021-01-20 ENCOUNTER — Ambulatory Visit (INDEPENDENT_AMBULATORY_CARE_PROVIDER_SITE_OTHER): Payer: 59 | Admitting: Cardiology

## 2021-01-20 VITALS — BP 126/82 | HR 84 | Ht 71.0 in | Wt 294.6 lb

## 2021-01-20 DIAGNOSIS — I10 Essential (primary) hypertension: Secondary | ICD-10-CM

## 2021-01-20 DIAGNOSIS — G4733 Obstructive sleep apnea (adult) (pediatric): Secondary | ICD-10-CM | POA: Diagnosis not present

## 2021-01-20 DIAGNOSIS — I480 Paroxysmal atrial fibrillation: Secondary | ICD-10-CM | POA: Diagnosis not present

## 2021-01-20 NOTE — Patient Instructions (Signed)
  Lab Work:  Your physician recommends that you have lab work today  If you have labs (blood work) drawn today and your tests are completely normal, you will receive your results only by: Marland Kitchen MyChart Message (if you have MyChart) OR . A paper copy in the mail If you have any lab test that is abnormal or we need to change your treatment, we will call you to review the results.  Follow-Up: At [facility], you and your health needs are our priority.  As part of our continuing mission to provide you with exceptional heart care, we have created designated Provider Care Teams.  These Care Teams include your primary Cardiologist (physician) and Advanced Practice Providers (APPs -  Physician Assistants and Nurse Practitioners) who all work together to provide you with the care you need, when you need it.  We recommend signing up for the patient portal called "MyChart".  Sign up information is provided on this After Visit Summary.  MyChart is used to connect with patients for Virtual Visits (Telemedicine).  Patients are able to view lab/test results, encounter notes, upcoming appointments, etc.  Non-urgent messages can be sent to your provider as well.   To learn more about what you can do with MyChart, go to [email or web address].    Your next appointment:   12 month(s)  The format for your next appointment:   In Person  Provider:   Olga Millers, MD

## 2021-01-21 LAB — CBC
Hematocrit: 46.5 % (ref 37.5–51.0)
Hemoglobin: 15.6 g/dL (ref 13.0–17.7)
MCH: 30.2 pg (ref 26.6–33.0)
MCHC: 33.5 g/dL (ref 31.5–35.7)
MCV: 90 fL (ref 79–97)
Platelets: 267 10*3/uL (ref 150–450)
RBC: 5.16 x10E6/uL (ref 4.14–5.80)
RDW: 12.2 % (ref 11.6–15.4)
WBC: 5.1 10*3/uL (ref 3.4–10.8)

## 2021-01-21 LAB — BASIC METABOLIC PANEL
BUN/Creatinine Ratio: 11 (ref 9–20)
BUN: 11 mg/dL (ref 6–24)
CO2: 23 mmol/L (ref 20–29)
Calcium: 9.5 mg/dL (ref 8.7–10.2)
Chloride: 101 mmol/L (ref 96–106)
Creatinine, Ser: 1.01 mg/dL (ref 0.76–1.27)
GFR calc Af Amer: 96 mL/min/{1.73_m2} (ref >59)
GFR calc non Af Amer: 83 mL/min/{1.73_m2} (ref >59)
Glucose: 94 mg/dL (ref 65–99)
Potassium: 4.6 mmol/L (ref 3.5–5.2)
Sodium: 142 mmol/L (ref 134–144)

## 2021-01-23 ENCOUNTER — Telehealth: Payer: Self-pay | Admitting: *Deleted

## 2021-01-23 ENCOUNTER — Other Ambulatory Visit: Payer: Self-pay | Admitting: Cardiovascular Disease

## 2021-01-23 DIAGNOSIS — I4891 Unspecified atrial fibrillation: Secondary | ICD-10-CM

## 2021-01-23 DIAGNOSIS — G4733 Obstructive sleep apnea (adult) (pediatric): Secondary | ICD-10-CM

## 2021-01-23 DIAGNOSIS — I10 Essential (primary) hypertension: Secondary | ICD-10-CM

## 2021-01-23 NOTE — Telephone Encounter (Signed)
PA for split night sleep study submitted to [employer] via web portal. 

## 2021-01-24 ENCOUNTER — Telehealth: Payer: Self-pay | Admitting: *Deleted

## 2021-01-24 ENCOUNTER — Encounter: Payer: Self-pay | Admitting: *Deleted

## 2021-01-24 ENCOUNTER — Other Ambulatory Visit: Payer: Self-pay | Admitting: Internal Medicine

## 2021-01-24 ENCOUNTER — Other Ambulatory Visit: Payer: Self-pay | Admitting: Cardiovascular Disease

## 2021-01-24 DIAGNOSIS — G4733 Obstructive sleep apnea (adult) (pediatric): Secondary | ICD-10-CM

## 2021-01-24 NOTE — Telephone Encounter (Signed)
Left message to return a call to discuss his sleep study appointment.

## 2021-01-24 NOTE — Telephone Encounter (Signed)
Patient returned a call to me and was given sleep study appointment details. 

## 2021-01-26 ENCOUNTER — Encounter: Payer: Self-pay | Admitting: *Deleted

## 2021-02-24 ENCOUNTER — Ambulatory Visit (HOSPITAL_BASED_OUTPATIENT_CLINIC_OR_DEPARTMENT_OTHER): Payer: 59 | Attending: Cardiovascular Disease | Admitting: Cardiovascular Disease

## 2021-02-24 ENCOUNTER — Other Ambulatory Visit: Payer: Self-pay

## 2021-02-24 DIAGNOSIS — G4733 Obstructive sleep apnea (adult) (pediatric): Secondary | ICD-10-CM | POA: Diagnosis not present

## 2021-02-24 DIAGNOSIS — I480 Paroxysmal atrial fibrillation: Secondary | ICD-10-CM | POA: Diagnosis not present

## 2021-03-02 ENCOUNTER — Encounter: Payer: Self-pay | Admitting: Family Medicine

## 2021-03-02 DIAGNOSIS — M6530 Trigger finger, unspecified finger: Secondary | ICD-10-CM

## 2021-03-06 ENCOUNTER — Other Ambulatory Visit: Payer: Self-pay

## 2021-03-06 ENCOUNTER — Ambulatory Visit: Payer: 59 | Admitting: Internal Medicine

## 2021-03-06 ENCOUNTER — Encounter: Payer: Self-pay | Admitting: Internal Medicine

## 2021-03-06 VITALS — BP 140/94 | HR 90 | Ht 71.0 in | Wt 280.8 lb

## 2021-03-06 DIAGNOSIS — I10 Essential (primary) hypertension: Secondary | ICD-10-CM | POA: Diagnosis not present

## 2021-03-06 DIAGNOSIS — I480 Paroxysmal atrial fibrillation: Secondary | ICD-10-CM

## 2021-03-06 DIAGNOSIS — G4733 Obstructive sleep apnea (adult) (pediatric): Secondary | ICD-10-CM

## 2021-03-06 NOTE — Patient Instructions (Addendum)
Medication Instructions:  Your physician recommends that you continue on your current medications as directed. Please refer to the Current Medication list given to you today.  Labwork: None ordered.  Testing/Procedures: None ordered.  Follow-Up: Your physician wants you to follow-up in: 09/11/21 at 10:45 am with Hillis Range, MD    Any Other Special Instructions Will Be Listed Below (If Applicable).  If you need a refill on your cardiac medications before your next appointment, please call your pharmacy.
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
## 2021-03-14 ENCOUNTER — Encounter (HOSPITAL_BASED_OUTPATIENT_CLINIC_OR_DEPARTMENT_OTHER): Payer: Self-pay | Admitting: Cardiovascular Disease

## 2021-03-14 ENCOUNTER — Other Ambulatory Visit: Payer: Self-pay

## 2021-03-14 ENCOUNTER — Encounter: Payer: Self-pay | Admitting: Family Medicine

## 2021-03-14 ENCOUNTER — Ambulatory Visit (INDEPENDENT_AMBULATORY_CARE_PROVIDER_SITE_OTHER): Payer: 59 | Admitting: Family Medicine

## 2021-03-14 DIAGNOSIS — M65342 Trigger finger, left ring finger: Secondary | ICD-10-CM | POA: Diagnosis not present

## 2021-03-14 MED ORDER — DICLOFENAC SODIUM 1 % EX GEL: 4.0000 g | Freq: Four times a day (QID) | CUTANEOUS | 6 refills | Status: DC | PRN

## 2021-03-14 NOTE — Procedures (Signed)
     Patient Name: Jeffery Nielsen, Jeffery Nielsen Date: 02/26/2021 Gender: Male D.O.B: 08/11/1966 Age (years): 54 Referring Provider: Newman Nip Height (inches): 71 Interpreting Physician: [name] MD, ABSM Weight (lbs): 278 RPSGT: Panacea Sink BMI: 39 MRN: [id number] Neck Size: 19.00  CLINICAL INFORMATION Sleep Study Type: HST  Indication for sleep study: Daytime Fatigue, Hypertension, Morbid Obesity, Morning Headaches, Snoring, Atrial fibrillation  Epworth Sleepiness Score: 11  Most recent polysomnogram dated 03/27/2018 revealed an AHI of 13.1/h and RDI of 21.7/h.  SLEEP STUDY TECHNIQUE A multi-channel overnight portable sleep study was performed. The channels recorded were: nasal airflow, thoracic respiratory movement, and oxygen saturation with a pulse oximetry. Snoring was also monitored.  MEDICATIONS albuterol (PROAIR HFA) 108 (90 Base) MCG/ACT inhaler APPLE CIDER VINEGAR PO diclofenac Sodium (VOLTAREN) 1 % GEL diltiazem (CARDIZEM CD) 240 MG 24 hr capsule enalapril (VASOTEC) 20 MG tablet hydrochlorothiazide (MICROZIDE) 12.5 MG capsule metoprolol succinate (TOPROL XL) 25 MG 24 hr tablet XARELTO 20 MG TABS tablet Patient self administered medications include: METOPROLOL.  SLEEP ARCHITECTURE Patient was studied for 435.7 minutes. The sleep efficiency was 100.0 % and the patient was supine for 80.7%. The arousal index was 0.0 per hour.  RESPIRATORY PARAMETERS The overall AHI was 46.8 per hour, with a central apnea index of 0 per hour.  The oxygen nadir was 80% during sleep.  CARDIAC DATA Mean heart rate during sleep was 78.3 bpm.  IMPRESSIONS - Severe obstructive sleep apnea occurred during this study (AHI 46.8/h). The severity during REM sleep cannot be assessed on this home study. There is a positional component with supine sleep AHI 49.7/h vs non-supine AHI 35.0/h. - Severe oxygen desaturation was noted during this study (Min O2 80%). Time spent below 89%  was 3.5 minutes. - Patient snored 39.7% during the sleep.  DIAGNOSIS - Obstructive Sleep Apnea (G47.33)  RECOMMENDATIONS - Recommend CPAP titration study for further evaluation and treatment of the patient's severe sleep disordered breathing. If unable to schedule an in-lab titration, initiate Auto-PAP with EPR of 3 at 7-20 cm of water. - Effort should be made to optimize nasal and oropharyngeal patency. - Positional therapy avoiding supine position during sleep. - Avoid alcohol, sedatives and other CNS depressants that may worsen sleep apnea and disrupt normal sleep architecture. - Sleep hygiene should be reviewed to assess factors that may improve sleep quality. - Weight management (BMI 39) and regular exercise should be initiated or continued. - Return to Sleep Center to discuss the results of this study -  [Electronically signed] 03/14/2021 03:36 PM  [name] MD, [facility], ABSM Diplomate, American Board of Sleep Medicine   NPI: [id number] [facility] PH: [phone]   FX: [phone] ACCREDITED BY THE AMERICAN ACADEMY OF SLEEP MEDICINE
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
## 2021-03-16 ENCOUNTER — Other Ambulatory Visit: Payer: Self-pay | Admitting: Cardiovascular Disease

## 2021-03-16 ENCOUNTER — Telehealth: Payer: Self-pay | Admitting: *Deleted

## 2021-03-16 DIAGNOSIS — G4736 Sleep related hypoventilation in conditions classified elsewhere: Secondary | ICD-10-CM

## 2021-03-16 DIAGNOSIS — G4733 Obstructive sleep apnea (adult) (pediatric): Secondary | ICD-10-CM

## 2021-03-16 NOTE — Telephone Encounter (Signed)
PA for in lab CPAP titration sent to [facility] via web portal. Left message on patient's VM of HST results and recommendations. PA request submitted to [facility]. If they deny, Dr Tresa Endo has order placed for Auto-Pap. I will call him again once I hear back from his [facility].

## 2021-03-21 ENCOUNTER — Telehealth: Payer: Self-pay | Admitting: *Deleted

## 2021-03-21 NOTE — Telephone Encounter (Signed)
Left message that Jeffery Nielsen denied him having CPAP titration. APAP order has been sent to [facility]. Once they have received his insurance benefits and a CPAP machine becomes available they will call him to come in for set up.

## 2021-07-18 ENCOUNTER — Encounter: Payer: Self-pay | Admitting: Internal Medicine

## 2021-07-29 ENCOUNTER — Other Ambulatory Visit: Payer: Self-pay | Admitting: Family Medicine

## 2021-09-11 ENCOUNTER — Ambulatory Visit: Payer: 59 | Admitting: Internal Medicine

## 2021-09-20 ENCOUNTER — Other Ambulatory Visit: Payer: Self-pay

## 2021-09-20 ENCOUNTER — Ambulatory Visit: Payer: 59 | Admitting: Internal Medicine

## 2021-09-20 VITALS — BP 122/74 | HR 86 | Ht 71.0 in | Wt 286.2 lb

## 2021-09-20 DIAGNOSIS — I10 Essential (primary) hypertension: Secondary | ICD-10-CM | POA: Diagnosis not present

## 2021-09-20 DIAGNOSIS — I480 Paroxysmal atrial fibrillation: Secondary | ICD-10-CM | POA: Diagnosis not present

## 2021-09-20 DIAGNOSIS — G4733 Obstructive sleep apnea (adult) (pediatric): Secondary | ICD-10-CM | POA: Diagnosis not present

## 2021-09-20 NOTE — Patient Instructions (Addendum)
Medication Instructions:  °Your physician recommends that you continue on your current medications as directed. Please refer to the Current Medication list given to you today. °*If you need a refill on your cardiac medications before your next appointment, please call your pharmacy* ° °Lab Work: °None. °If you have labs (blood work) drawn today and your tests are completely normal, you will receive your results only by: °MyChart Message (if you have MyChart) OR °A paper copy in the mail °If you have any lab test that is abnormal or we need to change your treatment, we will call you to review the results. ° °Testing/Procedures: °None. ° °Follow-Up: °At [facility], you and your health needs are our priority.  As part of our continuing mission to provide you with exceptional heart care, we have created designated Provider Care Teams.  These Care Teams include your primary Cardiologist (physician) and Advanced Practice Providers (APPs -  Physician Assistants and Nurse Practitioners) who all work together to provide you with the care you need, when you need it. ° °Your physician wants you to follow-up in: 6 months with the Afib Clinic. They will contact you to schedule.  °  You will receive a reminder letter in the mail two months in advance. If you don't receive a letter, please call our office to schedule the follow-up appointment. ° °We recommend signing up for the patient portal called "MyChart".  Sign up information is provided on this After Visit Summary.  MyChart is used to connect with patients for Virtual Visits (Telemedicine).  Patients are able to view lab/test results, encounter notes, upcoming appointments, etc.  Non-urgent messages can be sent to your provider as well.   °To learn more about what you can do with MyChart, go to [email or web address].   ° °Any Other Special Instructions Will Be Listed Below (If Applicable). ° ° ° ° °  ° ° °
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
## 2021-10-05 ENCOUNTER — Other Ambulatory Visit: Payer: Self-pay | Admitting: Cardiology

## 2021-11-05 ENCOUNTER — Other Ambulatory Visit: Payer: Self-pay | Admitting: Internal Medicine

## 2021-11-05 ENCOUNTER — Other Ambulatory Visit: Payer: Self-pay | Admitting: Family Medicine

## 2021-11-06 NOTE — Telephone Encounter (Signed)
Prescription refill request for Xarelto received.  Indication: afib  Last office visit: [place], 09/20/2021 Weight: 129.8 kg Age: 55 Scr: 1.01, 01/20/2021 CrCl: 148 ml/min   Refill sent.

## 2021-11-21 ENCOUNTER — Other Ambulatory Visit: Payer: Self-pay | Admitting: Internal Medicine

## 2021-12-01 ENCOUNTER — Ambulatory Visit: Payer: 59 | Admitting: Cardiovascular Disease

## 2021-12-01 ENCOUNTER — Encounter: Payer: Self-pay | Admitting: Cardiovascular Disease

## 2021-12-01 ENCOUNTER — Other Ambulatory Visit: Payer: Self-pay

## 2021-12-01 DIAGNOSIS — I10 Essential (primary) hypertension: Secondary | ICD-10-CM

## 2021-12-01 DIAGNOSIS — I480 Paroxysmal atrial fibrillation: Secondary | ICD-10-CM | POA: Diagnosis not present

## 2021-12-01 DIAGNOSIS — G4733 Obstructive sleep apnea (adult) (pediatric): Secondary | ICD-10-CM

## 2021-12-01 NOTE — Patient Instructions (Signed)
Medication Instructions:  °The current medical regimen is effective;  continue present plan and medications as directed. Please refer to the Current Medication list given to you today.  ° °*If you need a refill on your cardiac medications before your next appointment, please call your pharmacy* ° °Lab Work:   Testing/Procedures:  °NONE    NONE ° °Follow-Up: °Your next appointment:  12 month(s) In Person with THOMAS KELLY, MD  ° °Please call our office 2 months in advance to schedule this appointment ° °At [facility], you and your health needs are our priority.  As part of our continuing mission to provide you with exceptional heart care, we have created designated Provider Care Teams.  These Care Teams include your primary Cardiologist (physician) and Advanced Practice Providers (APPs -  Physician Assistants and Nurse Practitioners) who all work together to provide you with the care you need, when you need it. ° ° °
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
## 2022-01-31 ENCOUNTER — Other Ambulatory Visit: Payer: Self-pay | Admitting: Family Medicine

## 2022-05-01 ENCOUNTER — Encounter: Payer: Self-pay | Admitting: *Deleted

## 2022-05-14 ENCOUNTER — Encounter (HOSPITAL_COMMUNITY): Payer: Self-pay

## 2022-05-14 ENCOUNTER — Emergency Department (HOSPITAL_COMMUNITY): Payer: 59

## 2022-05-14 ENCOUNTER — Other Ambulatory Visit: Payer: Self-pay

## 2022-05-14 ENCOUNTER — Emergency Department (HOSPITAL_COMMUNITY)
Admission: EM | Admit: 2022-05-14 | Discharge: 2022-05-14 | Disposition: A | Discharge status: Home / Self Care | Payer: 59 | Attending: Emergency Medicine | Admitting: Emergency Medicine

## 2022-05-14 DIAGNOSIS — R55 Syncope and collapse: Secondary | ICD-10-CM | POA: Diagnosis not present

## 2022-05-14 DIAGNOSIS — R42 Dizziness and giddiness: Secondary | ICD-10-CM | POA: Diagnosis not present

## 2022-05-14 LAB — COMPREHENSIVE METABOLIC PANEL
ALT: 18 U/L (ref 0–44)
AST: 17 U/L (ref 15–41)
Albumin: 4.1 g/dL (ref 3.5–5.0)
Alkaline Phosphatase: 64 U/L (ref 38–126)
Anion gap: 4 — ABNORMAL LOW (ref 5–15)
BUN: 13 mg/dL (ref 6–20)
CO2: 28 mmol/L (ref 22–32)
Calcium: 9.2 mg/dL (ref 8.9–10.3)
Chloride: 106 mmol/L (ref 98–111)
Creatinine, Ser: 0.96 mg/dL (ref 0.61–1.24)
GFR, Estimated: >60 mL/min (ref >60)
Glucose, Bld: 104 mg/dL — ABNORMAL HIGH (ref 70–99)
Potassium: 4.3 mmol/L (ref 3.5–5.1)
Sodium: 138 mmol/L (ref 135–145)
Total Bilirubin: 0.7 mg/dL (ref 0.3–1.2)
Total Protein: 7.7 g/dL (ref 6.5–8.1)

## 2022-05-14 LAB — URINALYSIS, ROUTINE W REFLEX MICROSCOPIC
Bilirubin Urine: NEGATIVE
Glucose, UA: NEGATIVE mg/dL
Hgb urine dipstick: NEGATIVE
Ketones, ur: NEGATIVE mg/dL
Nitrite: NEGATIVE
Protein, ur: NEGATIVE mg/dL
Specific Gravity, Urine: 1.023 (ref 1.005–1.030)
pH: 5 (ref 5.0–8.0)

## 2022-05-14 LAB — CBC
HCT: 45.2 % (ref 39.0–52.0)
Hemoglobin: 14.9 g/dL (ref 13.0–17.0)
MCH: 30.1 pg (ref 26.0–34.0)
MCHC: 33 g/dL (ref 30.0–36.0)
MCV: 91.3 fL (ref 80.0–100.0)
Platelets: 257 10*3/uL (ref 150–400)
RBC: 4.95 MIL/uL (ref 4.22–5.81)
RDW: 12.9 % (ref 11.5–15.5)
WBC: 5.2 10*3/uL (ref 4.0–10.5)
nRBC: 0 % (ref 0.0–0.2)

## 2022-05-14 LAB — CBG MONITORING, ED
Glucose-Capillary: 99 mg/dL (ref 70–99)
Glucose-Capillary: 87 mg/dL (ref 70–99)

## 2022-05-14 MED ORDER — MECLIZINE HCL 25 MG PO TABS
25.0000 mg | ORAL_TABLET | Freq: Three times a day (TID) | ORAL | 0 refills | Status: AC
Start: 2022-05-14 — End: 2022-05-21

## 2022-05-14 MED ORDER — SODIUM CHLORIDE 0.9 % IV BOLUS
1000.0000 mL | Freq: Once | INTRAVENOUS | Status: AC
  Administered 2022-05-14: 1000 mL via INTRAVENOUS

## 2022-05-14 MED ORDER — MECLIZINE HCL 25 MG PO TABS
25.0000 mg | ORAL_TABLET | Freq: Once | ORAL | Status: AC
  Administered 2022-05-14: 25 mg via ORAL
  Filled 2022-05-14: qty 1

## 2022-05-14 NOTE — ED Triage Notes (Signed)
Per EMS- Patient states he has been dizzy since waking this AM especially when he is trying to sit up. Dizziness is intermittent now. EMS states EKG- NSR with a few PVC's. Patient denies CP or N/V.

## 2022-05-14 NOTE — ED Notes (Signed)
Patient transported to X-ray 

## 2022-05-14 NOTE — ED Notes (Signed)
Pt d/c home per MD order. Discharge summary reviewed with pt, pt verbalizes understanding. Ambulatory off unit. No s/s of acute distress noted. Reports discharge ride home.  

## 2022-05-14 NOTE — ED Provider Notes (Signed)
[facility]-EMERGENCY DEPT Provider Note   CSN: [id number] Arrival date & time: 05/14/22  7829     History  Chief Complaint  Patient presents with   Dizziness    Jeffery Nielsen is a 56 y.o. male.  HPI Patient presents with concern of lightheadedness, near syncope and unsteadiness.  He has a history of multiple medical issues, notably A-fib, status post ablation.  No recent medication change, diet change, activity change.  He does drink alcohol, denies drug use or cigarette use. Today, after awakening, trying to sit upright he felt lightheaded, unsteady had returned to supine positioning.  This occurred 2 additional times, and he called for assistance. Currently no pain, complaints, lightheadedness or weakness anywhere.    Home Medications Prior to Admission medications   Medication Sig Start Date End Date Taking? Authorizing Provider  meclizine (ANTIVERT) 25 MG tablet Take 1 tablet (25 mg total) by mouth 3 (three) times daily for 7 days. 05/14/22 05/21/22 Yes Gerhard Munch, MD  albuterol (PROAIR HFA) 108 (90 Base) MCG/ACT inhaler INHALE 2 PUFFS INTO THE LUNGS EVERY 4 HOURS AS NEEDED FOR SHORTNESS OF BREATH. DO NOT USE REGULARLY 03/27/17   Chambliss, Estill Batten, MD  APPLE CIDER VINEGAR PO Take 1 tablet by mouth in the morning and at bedtime.    [provider]  diclofenac Sodium (VOLTAREN) 1 % GEL Apply 4 g topically 4 (four) times daily as needed. 03/14/21   Hilts, Casimiro Needle, MD  diltiazem (CARDIZEM CD) 240 MG 24 hr capsule TAKE 1 CAPSULE BY MOUTH EVERY DAY 10/06/21   Lewayne Bunting, MD  enalapril (VASOTEC) 20 MG tablet TAKE 1 TABLET BY MOUTH TWICE A DAY 11/07/21   Chambliss, Estill Batten, MD  hydrochlorothiazide (MICROZIDE) 12.5 MG capsule TAKE 1 CAPSULE BY MOUTH EVERY DAY 01/31/22   [name], MD  metoprolol succinate (TOPROL-XL) 25 MG 24 hr tablet TAKE 1 TABLET BY MOUTH DAILY. 11/21/21   Allred, Fayrene Fearing, MD  XARELTO 20 MG TABS tablet TAKE 1 TABLET BY  MOUTH DAILY WITH SUPPER. 11/06/21   Allred, Fayrene Fearing, MD      Allergies    Amoxicillin and Penicillins    Review of Systems   Review of Systems  All other systems reviewed and are negative.   Physical Exam Updated Vital Signs BP 137/84   Pulse 70   Temp 98 F (36.7 C) (Oral)   Resp 15   Ht 5\' 11"  (1.803 m)   Wt 129.7 kg   SpO2 100%   BMI 39.89 kg/m  Physical Exam Vitals and nursing note reviewed.  Constitutional:      General: He is not in acute distress.    Appearance: He is well-developed.  HENT:     Head: Normocephalic and atraumatic.  Eyes:     Conjunctiva/sclera: Conjunctivae normal.  Cardiovascular:     Rate and Rhythm: Normal rate and regular rhythm.  Pulmonary:     Effort: Pulmonary effort is normal. No respiratory distress.     Breath sounds: No stridor.  Abdominal:     General: There is no distension.  Musculoskeletal:     Right lower leg: No edema.     Left lower leg: No edema.  Skin:    General: Skin is warm and dry.  Neurological:     Mental Status: He is alert and oriented to person, place, and time.     ED Results / Procedures / Treatments   Labs (all labs ordered are listed, but only abnormal results  are displayed) Labs Reviewed  URINALYSIS, ROUTINE W REFLEX MICROSCOPIC - Abnormal; Notable for the following components:      Result Value   Leukocytes,Ua TRACE (*)    Bacteria, UA RARE (*)    All other components within normal limits  COMPREHENSIVE METABOLIC PANEL - Abnormal; Notable for the following components:   Glucose, Bld 104 (*)    Anion gap 4 (*)    All other components within normal limits  CBC  BRAIN NATRIURETIC PEPTIDE  ETHANOL  CBG MONITORING, ED  CBG MONITORING, ED    EKG EKG Interpretation  Date/Time:  Monday May 14 2022 08:37:29 EDT Ventricular Rate:  82 PR Interval:  158 QRS Duration: 76 QT Interval:  373 QTC Calculation: 436 R Axis:   66 Text Interpretation: Sinus rhythm No significant change since last tracing  Confirmed by Melene Plan [phone]) on 05/14/2022 9:50:18 AM  Radiology MR BRAIN WO CONTRAST  Result Date: 05/14/2022 CLINICAL DATA:  Dizziness, lightheadedness EXAM: MRI HEAD WITHOUT CONTRAST TECHNIQUE: Multiplanar, multiecho pulse sequences of the brain and surrounding structures were obtained without intravenous contrast. COMPARISON:  Brain MRI 10/10/2015 FINDINGS: Brain: There is no acute intracranial hemorrhage, extra-axial fluid collection, or acute infarct. Parenchymal volume is normal. The ventricles are normal in size. Gray-white differentiation is preserved. Scattered small foci of FLAIR signal abnormality in the subcortical and periventricular white matter are nonspecific but likely reflects sequela of mild chronic white matter microangiopathy. There is no mass lesion.  There is no mass effect or midline shift. Vascular: Normal flow voids. Skull and upper cervical spine: Normal marrow signal. Sinuses/Orbits: The paranasal sinuses are clear. The globes and orbits are unremarkable. Other: None. IMPRESSION: 1. No acute intracranial pathology. 2. Mild chronic white matter microangiopathy. Electronically Signed   By: Lesia Hausen M.D.   On: 05/14/2022 15:16   DG Chest 2 View  Result Date: 05/14/2022 CLINICAL DATA:  Near-syncope this morning. EXAM: CHEST - 2 VIEW COMPARISON:  March 17, 2020 FINDINGS: The heart size and mediastinal contours are within normal limits. Both lungs are clear. The visualized skeletal structures are unremarkable. IMPRESSION: No active cardiopulmonary disease. Electronically Signed   By: Sherian Rein M.D.   On: 05/14/2022 12:23    Procedures Procedures    Medications Ordered in ED Medications  meclizine (ANTIVERT) tablet 25 mg (has no administration in time range)  sodium chloride 0.9 % bolus 1,000 mL (0 mLs Intravenous Stopped 05/14/22 1438)    This patient with a Hx of A-fib presents to the ED for concern of near syncope, this involves an extensive number of treatment  options, and is a complaint that carries with it a high risk of complications and morbidity.    The differential diagnosis includes A-fib, dehydration, electrolyte abnormalities, infection   Social Determinants of Health:  No limitations  Additional history obtained:  Additional history and/or information obtained from EMS, notable for rhythm strips x2, both with similar sinus rhythm, rate 80s, no ischemic changes   After the initial evaluation, orders, including: Labs monitoring ECG were initiated.   Patient placed on Cardiac and Pulse-Oximetry Monitors. The patient was maintained on a cardiac monitor.  The cardiac monitored showed an rhythm of sinus 80 normal The patient was also maintained on pulse oximetry. The readings were typically 100% room air normal   On repeat evaluation of the patient stayed the same Update with ongoing dizziness MRI ordered Lab Tests:  I personally interpreted labs.  The pertinent results include: Trace evidence for dehydration  Imaging Studies ordered:  I independently visualized and interpreted imaging which showed no stroke, no pneumonia I agree with the radiologist interpretation  4:35 PM Patient weight, alert, no distress.  We discussed all findings at length Dispostion / Final MDM:  After consideration of the diagnostic results and the patient's response to treatment, this adult male presents with dizziness, positional.  Patient evaluation here is generally reassuring aside from dizziness with some positioning.  Neurologic exam otherwise unremarkable, MRI unremarkable, no evidence for stroke.  Patient has no chest pain, no dyspnea, lower suspicion for cardiogenic etiology.  After reassuring findings I discussed trial of meclizine with the patient, outpatient follow-up with return precautions he was amenable to this, discharged in stable condition.  Final Clinical Impression(s) / ED Diagnoses Final diagnoses:  Dizziness    Rx / DC  Orders ED Discharge Orders          Ordered    meclizine (ANTIVERT) 25 MG tablet  3 times daily        05/14/22 1635              Carmin Muskrat, MD 05/14/22 1635

## 2022-05-14 NOTE — ED Notes (Signed)
Patient transported to MRI 

## 2022-05-14 NOTE — Discharge Instructions (Signed)
As discussed, your evaluation today has been largely reassuring.  But, it is important that you monitor your condition carefully, and do not hesitate to return to the ED if you develop new, or concerning changes in your condition. ? ?Otherwise, please follow-up with your physician for appropriate ongoing care. ? ?

## 2022-05-14 NOTE — ED Provider Triage Note (Signed)
Emergency Medicine Provider Triage Evaluation Note  Jeffery Nielsen , a 56 y.o. male  was evaluated in triage.  Pt complains of dizziness.  Patient states that when he woke up this morning and tried to get out of bed he felt him self "going sideways".  He felt lightheaded and that he might pass out, so he laid down on his bed.  This happened almost every time he tried to get up.  States that he was doing some work over the weekend, and does not know if he was hydrating well enough.  History of A-fib, on Xarelto.  Review of Systems  Positive: Dizziness, lightheadedness Negative: Syncope, chest pain, shortness of breath  Physical Exam  BP (!) 170/94 (BP Location: Right Arm)   Temp 98 F (36.7 C) (Oral)   Resp 18   Ht 5\' 11"  (1.803 m)   Wt 129.7 kg   SpO2 100% Comment: Simultaneous filing. User may not have seen previous data.  BMI 39.89 kg/m  Gen:   Awake, no distress   Resp:  Normal effort  MSK:   Moves extremities without difficulty  Other:  No facial droop, speech is clear  Medical Decision Making  Medically screening exam initiated at 9:37 AM.  Appropriate orders placed.  Jeffery Nielsen was informed that the remainder of the evaluation will be completed by another provider, this initial triage assessment does not replace that evaluation, and the importance of remaining in the ED until their evaluation is complete.     [name], Su Monks 05/14/22 05/16/22

## 2022-05-22 ENCOUNTER — Encounter: Payer: Self-pay | Admitting: Family Medicine

## 2022-05-22 ENCOUNTER — Ambulatory Visit (INDEPENDENT_AMBULATORY_CARE_PROVIDER_SITE_OTHER): Payer: 59 | Admitting: Family Medicine

## 2022-05-22 VITALS — BP 149/87 | HR 88 | Ht 71.0 in | Wt 289.2 lb

## 2022-05-22 DIAGNOSIS — Z09 Encounter for follow-up examination after completed treatment for conditions other than malignant neoplasm: Secondary | ICD-10-CM

## 2022-05-22 DIAGNOSIS — R42 Dizziness and giddiness: Secondary | ICD-10-CM | POA: Diagnosis not present

## 2022-05-22 DIAGNOSIS — I10 Essential (primary) hypertension: Secondary | ICD-10-CM | POA: Diagnosis not present

## 2022-05-22 MED ORDER — HYDROCHLOROTHIAZIDE 12.5 MG PO CAPS: 25.0000 mg | ORAL_CAPSULE | Freq: Every day | ORAL | 0 refills | Status: DC

## 2022-05-22 NOTE — Progress Notes (Signed)
    SUBJECTIVE:   CHIEF COMPLAINT / HPI:   ED follow up, Vertigo -Seen 6/19 and was given meclizine -Hx of vertigo 2-3 yrs ago with ear infection -Endorses head, nasal congestion; uses neti pot regularly -Denies vertigo, tinnitus, loss of balance today -Has not used meclizine since yesterday -Plans to travel to [place] in a few days by train  Hypertension - Medications: diltiazem 240 mg daily, vasotec 20 mg daily, HCTZ 12.5 mg daily, metoprolol 25 mg daily - Compliance: Yes - Checking BP at home: No, but has home cuff - Denies any palpitation, SOB, CP, vision changes, LE edema, medication SEs, or symptoms of hypotension - Diet: Denies a high salt diet   PERTINENT  PMH / PSH: Afib, Asthma, OSA  OBJECTIVE:   BP (!) 149/87   Pulse 88   Ht 5\' 11"  (1.803 m)   Wt 289 lb 3.2 oz (131.2 kg)   SpO2 98%   BMI 40.34 kg/m   General: Appears well, no acute distress. Age appropriate. HEENT: Patent nares. Evidence of congestion behind TMs b/l, otherwise wnl. Cardiac: RRR, normal heart sounds, no murmurs Respiratory: CTAB, normal effort Extremities: No edema or cyanosis. Skin: Warm and dry, no rashes noted Neuro:  CN II: PERRL CN III, IV,VI: EOMI CV V: Normal sensation in V1, V2, V3 CVII: Symmetric smile and brow raise CN VIII: Normal hearing CN IX,X: Symmetric palate raise  CN XI: 5/5 shoulder shrug CN XII: Symmetric tongue protrusion  UE and LE strength 5/5 2+ UE and LE reflexes  Normal sensation in UE and LE bilaterally  Negative Rhomberg  Psych: normal affect  ASSESSMENT/PLAN:   Follow-up exam, Dizziness Evaluated in ED 1 week prior. Symptoms have improved. Hx of afib rate controlled and anticoagulated. Normal EKG reviewed from ED visit. Suspect allergies v. Sinusitis increasing pressure behind TM. We discussed flonase to help with this. He plans to get this OTC. Otherwise can stop the meclizine. If reoccurs can consider ENT with increased frequency of these episodes.  Primary  hypertension Elevated. Multifactorial. Discussed weight and diet. Also consider under treated OSA. Increased HCTZ; continue other medications at their current dose. Follow up in 2 weeks for BP check.  - hydrochlorothiazide (MICROZIDE) 12.5 MG capsule; Take 2 capsules (25 mg total) by mouth daily.  Dispense: 180 capsule; Refill: 0   , DO  [facility]

## 2022-07-10 ENCOUNTER — Ambulatory Visit: Payer: 59 | Admitting: Student

## 2022-07-17 ENCOUNTER — Encounter: Payer: Self-pay | Admitting: Family Medicine

## 2022-07-17 ENCOUNTER — Ambulatory Visit (INDEPENDENT_AMBULATORY_CARE_PROVIDER_SITE_OTHER): Payer: 59 | Admitting: Family Medicine

## 2022-07-17 ENCOUNTER — Other Ambulatory Visit: Payer: Self-pay

## 2022-07-17 VITALS — BP 133/79 | HR 84 | Wt 269.0 lb

## 2022-07-17 DIAGNOSIS — Z125 Encounter for screening for malignant neoplasm of prostate: Secondary | ICD-10-CM

## 2022-07-17 DIAGNOSIS — I480 Paroxysmal atrial fibrillation: Secondary | ICD-10-CM

## 2022-07-17 DIAGNOSIS — Z13220 Encounter for screening for lipoid disorders: Secondary | ICD-10-CM | POA: Diagnosis not present

## 2022-07-17 DIAGNOSIS — M25561 Pain in right knee: Secondary | ICD-10-CM | POA: Diagnosis not present

## 2022-07-17 DIAGNOSIS — I10 Essential (primary) hypertension: Secondary | ICD-10-CM

## 2022-07-17 MED ORDER — ZOSTER VAC RECOMB ADJUVANTED 50 MCG/0.5ML IM SUSR: INTRAMUSCULAR | 1 refills | Status: DC

## 2022-07-17 NOTE — Assessment & Plan Note (Signed)
BP Readings from Last 3 Encounters:  07/17/22 133/79  05/22/22 (!) 149/87  05/14/22 (!) 154/88   At goal today Continue current medications

## 2022-07-17 NOTE — Patient Instructions (Signed)
Good to see you today - Thank you for coming in  Things we discussed today:  Knee Stretch well before playing or exercising Ice after - 15 minutes Tylenol for pain  Let me know if not improving will get an xray  Your goal blood pressure is less than 140/90.  Check your blood pressure several times a week.  If regularly higher than this please let me know - either with MyChart or leaving a phone message. Next visit please bring in your blood pressure cuff.     Follow up Dr Jens Som   I will call you if your tests are not good.  Otherwise, I will send you a message on MyChart (if it is active) or a letter in the mail..  If you do not hear from me with in 2 weeks please call our office.      I sent a prescription to your pharmacy for your Zoster vaccines to help prevent Shingles.  The shot may cause a sore arm and mild flu like symptoms for a few days.  You will need a second shot 2 months after your first.    To help you have your wishes followed I have given you a form to name a Health Care Power of [phone].  Please complete it.  You can bring me a copy next visit.     Please always bring your medication bottles  Come back to see me in 6 months

## 2022-07-17 NOTE — Assessment & Plan Note (Signed)
Seems well controlled.  Continue DOAC

## 2022-07-17 NOTE — Assessment & Plan Note (Signed)
Consistent with tendonitis.  No signs of ligament or meniscus injury or tumor.  Likely has component of DJD.  Conservative management with follow up as needed

## 2022-07-17 NOTE — Progress Notes (Signed)
    SUBJECTIVE:   CHIEF COMPLAINT / HPI:   R Knee Pain About 2 weeks ago after playing tennis and standing a lot.  Awoke with it.  Maybe some swelling, no locking occaisional mild giving way.  No specific injury and no prior knee problems.  He is able to move around well but hurts especially at night.    Primary hypertension Elevated. Multifactorial. Discussed weight and diet. Also consider under treated OSA. Increased HCTZ; continue other medications at their current dose. Follow up in 2 weeks for BP check.  - hydrochlorothiazide (MICROZIDE) 12.5 MG capsule; Take 2 capsules (25 mg total) by mouth daily.  Dispense: 180 capsule; Refill: 0  Afib Taking xarelto daily.  No bleeding  Weight Eating more veggies and fruits and playing tennis and BB.  Has lost about 20 lbs   PERTINENT  PMH / PSH: traveling to [place] for family reunion   OBJECTIVE:   BP 133/79   Pulse 84   Wt 269 lb (122 kg)   SpO2 99%   BMI 37.52 kg/m   Heart - Regular rate and rhythm.  No murmurs, gallops or rubs.    Lungs:  Normal respiratory effort, chest expands symmetrically. Lungs are clear to auscultation, no crackles or wheezes. Extremities:  No cyanosis, edema, or deformity noted with  R Knee - FROM perhaps mild effusion No laxity and negative meniscal signs.  Tender over medial posterior tendon insertion.  Moderate crepitus in both knees   ASSESSMENT/PLAN:   Right knee pain Consistent with tendonitis.  No signs of ligament or meniscus injury or tumor.  Likely has component of DJD.  Conservative management with follow up as needed   Atrial fibrillation (HCC) Seems well controlled.  Continue DOAC  Hypertension BP Readings from Last 3 Encounters:  07/17/22 133/79  05/22/22 (!) 149/87  05/14/22 (!) 154/88   At goal today Continue current medications    Annual Examination Male over 16 yo  I reviewed the following patient responses on our Physical Exam Form Tobacco use and candidacy for lung cancer or  aneurysm screening Alcohol Use  Weight  Exercise  Risk for STI  Fall risk Advanced Directive - gave paperwork Increased family cancer risk Violence risk  PHQ9 score reviewed  Blood pressure reviewed  I considered the following items based upon USPSTF recommendations: Colon cancer screening - UTD Prostate Cancer if male at birth - today HIV testing:  Hepatitis C testing Cholesterol screening - today  STI screening if high risk (Hepatitis B, Syphilis, Gonorrhea, Chlamydia) Immunizations - Influenza, Covid, Shingle, Pneumonia, Tetanus  See After Visit Summary for recommendations   [name], MD [facility]

## 2022-07-18 LAB — PSA: Prostate Specific Ag, Serum: 0.6 ng/mL (ref 0.0–4.0)

## 2022-07-18 LAB — LIPID PANEL
Chol/HDL Ratio: 3.4 ratio (ref 0.0–5.0)
Cholesterol, Total: 136 mg/dL (ref 100–199)
HDL: 40 mg/dL (ref >39)
LDL Chol Calc (NIH): 83 mg/dL (ref 0–99)
Triglycerides: 62 mg/dL (ref 0–149)
VLDL Cholesterol Cal: 13 mg/dL (ref 5–40)

## 2022-09-04 ENCOUNTER — Other Ambulatory Visit: Payer: Self-pay

## 2022-09-04 ENCOUNTER — Emergency Department (HOSPITAL_COMMUNITY): Payer: 59

## 2022-09-04 ENCOUNTER — Encounter (HOSPITAL_COMMUNITY): Payer: Self-pay

## 2022-09-04 ENCOUNTER — Observation Stay (HOSPITAL_COMMUNITY)
Admission: EM | Admit: 2022-09-04 | Discharge: 2022-09-05 | Disposition: A | Discharge status: Home / Self Care | Payer: 59 | Attending: Internal Medicine | Admitting: Internal Medicine

## 2022-09-04 ENCOUNTER — Observation Stay (HOSPITAL_BASED_OUTPATIENT_CLINIC_OR_DEPARTMENT_OTHER): Payer: 59

## 2022-09-04 DIAGNOSIS — J45909 Unspecified asthma, uncomplicated: Secondary | ICD-10-CM | POA: Diagnosis not present

## 2022-09-04 DIAGNOSIS — I4891 Unspecified atrial fibrillation: Secondary | ICD-10-CM

## 2022-09-04 DIAGNOSIS — R739 Hyperglycemia, unspecified: Secondary | ICD-10-CM | POA: Diagnosis present

## 2022-09-04 DIAGNOSIS — I480 Paroxysmal atrial fibrillation: Secondary | ICD-10-CM | POA: Diagnosis not present

## 2022-09-04 DIAGNOSIS — R0789 Other chest pain: Secondary | ICD-10-CM | POA: Diagnosis present

## 2022-09-04 DIAGNOSIS — Z79899 Other long term (current) drug therapy: Secondary | ICD-10-CM | POA: Diagnosis not present

## 2022-09-04 DIAGNOSIS — Z7901 Long term (current) use of anticoagulants: Secondary | ICD-10-CM | POA: Diagnosis not present

## 2022-09-04 DIAGNOSIS — E669 Obesity, unspecified: Secondary | ICD-10-CM | POA: Diagnosis present

## 2022-09-04 DIAGNOSIS — G4733 Obstructive sleep apnea (adult) (pediatric): Secondary | ICD-10-CM | POA: Diagnosis present

## 2022-09-04 DIAGNOSIS — I10 Essential (primary) hypertension: Secondary | ICD-10-CM | POA: Diagnosis not present

## 2022-09-04 LAB — ECHOCARDIOGRAM COMPLETE
AR max vel: 3 cm2
AV Area VTI: 3.13 cm2
AV Area mean vel: 2.72 cm2
AV Mean grad: 2 mmHg
AV Peak grad: 4.2 mmHg
Ao pk vel: 1.02 m/s
Area-P 1/2: 4.15 cm2
Calc EF: 61.7 %
S' Lateral: 2.9 cm
Single Plane A2C EF: 62.7 %
Single Plane A4C EF: 62.8 %

## 2022-09-04 LAB — CBC
HCT: 48.1 % (ref 39.0–52.0)
Hemoglobin: 16.2 g/dL (ref 13.0–17.0)
MCH: 30.2 pg (ref 26.0–34.0)
MCHC: 33.7 g/dL (ref 30.0–36.0)
MCV: 89.7 fL (ref 80.0–100.0)
Platelets: 285 10*3/uL (ref 150–400)
RBC: 5.36 MIL/uL (ref 4.22–5.81)
RDW: 13.1 % (ref 11.5–15.5)
WBC: 10.5 10*3/uL (ref 4.0–10.5)
nRBC: 0 % (ref 0.0–0.2)

## 2022-09-04 LAB — BASIC METABOLIC PANEL
Anion gap: 9 (ref 5–15)
BUN: 13 mg/dL (ref 6–20)
CO2: 27 mmol/L (ref 22–32)
Calcium: 9.3 mg/dL (ref 8.9–10.3)
Chloride: 102 mmol/L (ref 98–111)
Creatinine, Ser: 1.03 mg/dL (ref 0.61–1.24)
GFR, Estimated: >60 mL/min (ref >60)
Glucose, Bld: 119 mg/dL — ABNORMAL HIGH (ref 70–99)
Potassium: 3.8 mmol/L (ref 3.5–5.1)
Sodium: 138 mmol/L (ref 135–145)

## 2022-09-04 LAB — TSH: TSH: 0.896 u[IU]/mL (ref 0.350–4.500)

## 2022-09-04 LAB — MAGNESIUM: Magnesium: 1.9 mg/dL (ref 1.7–2.4)

## 2022-09-04 MED ORDER — ACETAMINOPHEN 650 MG RE SUPP: 650.0000 mg | Freq: Four times a day (QID) | RECTAL | Status: DC | PRN

## 2022-09-04 MED ORDER — DILTIAZEM HCL-DEXTROSE 125-5 MG/125ML-% IV SOLN (PREMIX)
5.0000 mg/h | INTRAVENOUS | Status: DC
  Administered 2022-09-04: 5 mg/h via INTRAVENOUS
  Filled 2022-09-04: qty 125

## 2022-09-04 MED ORDER — MAGNESIUM SULFATE 2 GM/50ML IV SOLN
2.0000 g | Freq: Once | INTRAVENOUS | Status: AC
  Administered 2022-09-04: 2 g via INTRAVENOUS
  Filled 2022-09-04: qty 50

## 2022-09-04 MED ORDER — ACETAMINOPHEN 325 MG PO TABS: 650.0000 mg | ORAL_TABLET | Freq: Four times a day (QID) | ORAL | Status: DC | PRN

## 2022-09-04 MED ORDER — METOPROLOL TARTRATE 5 MG/5ML IV SOLN
5.0000 mg | Freq: Once | INTRAVENOUS | Status: AC
  Administered 2022-09-04: 5 mg via INTRAVENOUS
  Filled 2022-09-04: qty 5

## 2022-09-04 MED ORDER — DILTIAZEM LOAD VIA INFUSION
15.0000 mg | Freq: Once | INTRAVENOUS | Status: AC
  Administered 2022-09-04: 15 mg via INTRAVENOUS
  Filled 2022-09-04: qty 15

## 2022-09-04 MED ORDER — METOPROLOL SUCCINATE ER 25 MG PO TB24: 25.0000 mg | ORAL_TABLET | Freq: Every day | ORAL | Status: DC

## 2022-09-04 MED ORDER — POTASSIUM CHLORIDE CRYS ER 20 MEQ PO TBCR
40.0000 meq | EXTENDED_RELEASE_TABLET | Freq: Once | ORAL | Status: AC
  Administered 2022-09-04: 40 meq via ORAL
  Filled 2022-09-04: qty 2

## 2022-09-04 MED ORDER — DILTIAZEM HCL ER COATED BEADS 240 MG PO CP24
240.0000 mg | ORAL_CAPSULE | Freq: Every day | ORAL | Status: DC
  Administered 2022-09-04 – 2022-09-05 (×2): 240 mg via ORAL
  Filled 2022-09-04: qty 2
  Filled 2022-09-04: qty 1

## 2022-09-04 MED ORDER — ENALAPRIL MALEATE 10 MG PO TABS
20.0000 mg | ORAL_TABLET | Freq: Two times a day (BID) | ORAL | Status: DC
  Administered 2022-09-04 – 2022-09-05 (×3): 20 mg via ORAL
  Filled 2022-09-04 (×3): qty 2

## 2022-09-04 MED ORDER — RIVAROXABAN 20 MG PO TABS
20.0000 mg | ORAL_TABLET | Freq: Every day | ORAL | Status: DC
  Administered 2022-09-04: 20 mg via ORAL
  Filled 2022-09-04: qty 1

## 2022-09-04 MED ORDER — METOPROLOL SUCCINATE ER 25 MG PO TB24
25.0000 mg | ORAL_TABLET | Freq: Every day | ORAL | Status: DC
  Administered 2022-09-04 – 2022-09-05 (×2): 25 mg via ORAL
  Filled 2022-09-04 (×2): qty 1

## 2022-09-04 MED ORDER — DILTIAZEM HCL ER COATED BEADS 120 MG PO CP24: 240.0000 mg | ORAL_CAPSULE | Freq: Every day | ORAL | Status: DC

## 2022-09-04 NOTE — ED Provider Notes (Signed)
[facility]-EMERGENCY DEPT Provider Note  CSN: [id number] Arrival date & time: 09/04/22 0404  Chief Complaint(s) Atrial Fibrillation  HPI Jeffery Nielsen is a 56 y.o. male with a past medical history listed below including paroxysmal A-fib status post ablation 2 years ago currently on metoprolol and diltiazem as well as Xarelto who presents to the emergency department for chest tightness that began at 3 AM.  Patient checked his Jeffery Nielsen and noted that he was in A-fib RVR prompting a call to EMS.  Currently patient denies any chest pressure.  Denies any shortness of breath.  No recent fevers or infections.  No coughing or congestion.  No nausea or vomiting.  No other physical complaints.  Patient did admit to missing doses of his diltiazem, metoprolol and Xarelto recently. Denies any illicit drug use or alcohol use.  The history is provided by the patient.    Past Medical History Past Medical History:  Diagnosis Date   Allergy    Asthma    Blood transfusion without reported diagnosis    Hypertension    OSA (obstructive sleep apnea)    not compliant with CPAP   Paroxysmal atrial fibrillation (HCC) 04/28/2013   Patient Active Problem List   Diagnosis Date Noted   Right knee pain 07/17/2022   Trigger finger 01/04/2021   Atrial fibrillation with RVR (HCC) 03/18/2020   Atrial fibrillation (HCC) 02/19/2019   OSA (obstructive sleep apnea) 12/01/2018   OBESITY 12/01/2009   Asthma 04/11/2009   Hypertension 01/23/2007   Home Medication(s) Prior to Admission medications   Medication Sig Start Date End Date Taking? Authorizing Provider  albuterol (PROAIR HFA) 108 (90 Base) MCG/ACT inhaler INHALE 2 PUFFS INTO THE LUNGS EVERY 4 HOURS AS NEEDED FOR SHORTNESS OF BREATH. DO NOT USE REGULARLY Patient taking differently: Inhale 2 puffs into the lungs every 4 (four) hours as needed for wheezing or shortness of breath. 03/27/17   [name], MD  diclofenac Sodium  (VOLTAREN) 1 % GEL Apply 4 g topically 4 (four) times daily as needed. 03/14/21   Hilts, Casimiro Needle, MD  diltiazem (CARDIZEM CD) 240 MG 24 hr capsule TAKE 1 CAPSULE BY MOUTH EVERY DAY Patient taking differently: Take 240 mg by mouth daily. 10/06/21   Lewayne Bunting, MD  enalapril (VASOTEC) 20 MG tablet TAKE 1 TABLET BY MOUTH TWICE A DAY 11/07/21   [name], MD  hydrochlorothiazide (MICROZIDE) 12.5 MG capsule Take 2 capsules (25 mg total) by mouth daily. 05/22/22   Autry-Lott, Randa Evens, DO  meclizine (ANTIVERT) 25 MG tablet Take 25 mg by mouth 3 (three) times daily as needed for dizziness.    [provider]  metoprolol succinate (TOPROL-XL) 25 MG 24 hr tablet TAKE 1 TABLET BY MOUTH DAILY. Patient taking differently: Take 25 mg by mouth daily. 11/21/21   Allred, Fayrene Fearing, MD  XARELTO 20 MG TABS tablet TAKE 1 TABLET BY MOUTH DAILY WITH SUPPER. Patient taking differently: Take 20 mg by mouth daily with supper. 11/06/21   Hillis Range, MD  Allergies Amoxil [amoxicillin] and Penicillins  Review of Systems Review of Systems As noted in HPI  Physical Exam Vital Signs  I have reviewed the triage vital signs BP 126/81 (BP Location: Right Arm)   Pulse 99   Temp (!) 97.4 F (36.3 C) (Oral)   Resp 19   SpO2 99%   Physical Exam Vitals reviewed.  Constitutional:      General: He is not in acute distress.    Appearance: He is well-developed. He is not diaphoretic.  HENT:     Head: Normocephalic and atraumatic.     Nose: Nose normal.  Eyes:     General: No scleral icterus.       Right eye: No discharge.        Left eye: No discharge.     Conjunctiva/sclera: Conjunctivae normal.     Pupils: Pupils are equal, round, and reactive to light.  Cardiovascular:     Rate and Rhythm: Tachycardia present. Rhythm irregularly irregular.     Heart sounds: No  murmur heard.    No friction rub. No gallop.  Pulmonary:     Effort: Pulmonary effort is normal. No respiratory distress.     Breath sounds: Normal breath sounds. No stridor. No rales.  Abdominal:     General: There is no distension.     Palpations: Abdomen is soft.     Tenderness: There is no abdominal tenderness.  Musculoskeletal:        General: No tenderness.     Cervical back: Normal range of motion and neck supple.     Right lower leg: No edema.     Left lower leg: No edema.  Skin:    General: Skin is warm and dry.     Findings: No erythema or rash.  Neurological:     Mental Status: He is alert and oriented to person, place, and time.     ED Results and Treatments Labs (all labs ordered are listed, but only abnormal results are displayed) Labs Reviewed  BASIC METABOLIC PANEL - Abnormal; Notable for the following components:      Result Value   Glucose, Bld 119 (*)    All other components within normal limits  MAGNESIUM  CBC  TSH                                                                                                                         EKG  EKG Interpretation  Date/Time:  Tuesday September 04 2022 04:13:30 EDT Ventricular Rate:  173 PR Interval:    QRS Duration: 71 QT Interval:  236 QTC Calculation: 412 R Axis:   75 Text Interpretation: Atrial fibrillation with rapid V-rate Repolarization abnormality, prob rate related Confirmed by Drema Pry [phone]) on 09/04/2022 6:19:37 AM       Radiology DG Chest Port 1 View  Result Date: 09/04/2022 CLINICAL DATA:  Tachycardia. EXAM: PORTABLE CHEST 1 VIEW COMPARISON:  05/14/2022 FINDINGS: The lungs are clear without focal pneumonia, edema,  pneumothorax or pleural effusion. The cardiopericardial silhouette is within normal limits for size. The visualized bony structures of the thorax are unremarkable. Telemetry leads overlie the chest. IMPRESSION: No active disease. Electronically Signed   By: Kennith Center  M.D.   On: 09/04/2022 06:25    Medications Ordered in ED Medications  diltiazem (CARDIZEM) 1 mg/mL load via infusion 15 mg (15 mg Intravenous Bolus from Bag 09/04/22 0433)    And  diltiazem (CARDIZEM) 125 mg in dextrose 5% 125 mL (1 mg/mL) infusion (15 mg/hr Intravenous Rate/Dose Change 09/04/22 0611)  acetaminophen (TYLENOL) tablet 650 mg (has no administration in time range)    Or  acetaminophen (TYLENOL) suppository 650 mg (has no administration in time range)  magnesium sulfate IVPB 2 g 50 mL (2 g Intravenous New Bag/Given 09/04/22 0706)  rivaroxaban (XARELTO) tablet 20 mg (has no administration in time range)  potassium chloride SA (KLOR-CON M) CR tablet 40 mEq (40 mEq Oral Given 09/04/22 0706)                                                                                                                                     Procedures .1-3 Lead EKG Interpretation  Performed by: Nira Conn, MD Authorized by: Nira Conn, MD     Interpretation: abnormal     ECG rate:  178   ECG rate assessment: tachycardic     Rhythm: atrial fibrillation     Ectopy: PVCs     Conduction: normal   .Critical Care  Performed by: Nira Conn, MD Authorized by: Nira Conn, MD   Critical care provider statement:    Critical care time (minutes):  45   Critical care time was exclusive of:  Separately billable procedures and treating other patients   Critical care was necessary to treat or prevent imminent or life-threatening deterioration of the following conditions:  Cardiac failure   Critical care was time spent personally by me on the following activities:  Development of treatment plan with patient or surrogate, discussions with consultants, evaluation of patient's response to treatment, examination of patient, obtaining history from patient or surrogate, review of old charts, re-evaluation of patient's condition, pulse oximetry, ordering and review of  radiographic studies, ordering and review of laboratory studies and ordering and performing treatments and interventions   Care discussed with: admitting provider     (including critical care time)  Medical Decision Making / ED Course   Medical Decision Making Amount and/or Complexity of Data Reviewed Labs: ordered. Decision-making details documented in ED Course. Radiology: ordered and independent interpretation performed. Decision-making details documented in ED Course. ECG/medicine tests: ordered and independent interpretation performed. Decision-making details documented in ED Course.  Risk Prescription drug management. Drug therapy requiring intensive monitoring for toxicity. Decision regarding hospitalization.    Patient presents in A-fib RVR. Likely related to recent missed doses. Will assess for any electrolyte derangements.  CBC without  leukocytosis or anemia Metabolic panel without significant electrolyte derangement or renal sufficiency Chest x-ray without evidence of pneumonia, pneumothorax, pulmonary edema or pleural effusions.  Given his missed doses patient is not a candidate for cardioversion at this time. Patient started on dill bolus and drip.  On reassessment, patient's heart rate is improving now down to the 120s-130s.  Maintaining stable blood pressures.  Case discussed with Dr. Velia Meyer from hospitalist service for admission and further management.       Final Clinical Impression(s) / ED Diagnoses Final diagnoses:  Atrial fibrillation with RVR ([place])           This chart was dictated using voice recognition software.  Despite best efforts to proofread,  errors can occur which can change the documentation meaning.    Fatima Blank, MD 09/04/22 [phone]

## 2022-09-04 NOTE — Progress Notes (Signed)
  Echocardiogram 2D Echocardiogram has been performed.  Jeffery Nielsen 09/04/2022, 11:30 AM

## 2022-09-04 NOTE — ED Notes (Signed)
Pt in bed, pt reports some discomfort with iv in L ac, slight swelling noted, no blood return, flush with pain, suspected infiltration, pharmacy noted, per pharmacy warm pack and massage for dilt infiltration, warm pack placed, encouraged pt to rub area.  Pt on cardiac monitor and appears to have changed rhythm to a sinus rhythm.  Md notifed, per md sill ok to give ordered medications, meds given.  Pt in bed, pt denies pain, pt has no requests, echo at bedside.

## 2022-09-04 NOTE — ED Triage Notes (Signed)
Pt has a machine at home that told him that he was going into a fib. Pt states that he initially had chest pressure but no pain as of now. Pt alert and oriented.

## 2022-09-04 NOTE — ED Notes (Signed)
Pt sitting up in bed eating lunch, pt denies pain at this time, pt offers no complaints.

## 2022-09-04 NOTE — ED Notes (Signed)
Pt in bed, pt has no complaints at this time.

## 2022-09-04 NOTE — Progress Notes (Signed)
  Carryover admission to the Day Admitter.  I discussed this case with the EDP, Dr.  Leonette Monarch.  Per these discussions:  This is a 56 year old male with history of paroxysmal atrial fibrillation, chronically anticoagulated on Xarelto, who is being admitted with atrial fibrillation with RVR after presenting from home complaining of an episode of chest tightness.  Patient reportedly awoke around 3 AM w/ Episode of chest tightness.  This prompted him to check his Jodelle Red app, which reportedly suggestive of atrial fibrillation, prompting him to contact EMS, who noted the patient to be in atrial fibrillation with RVR, with initial heart rates in the 160s to 180s.  The patient's chest pain reportedly resolved in spontaneous fashion prior to arriving in the emergency department, and in the absence of any associated nitroglycerin.  He has remained chest pain-free following resolution of the 3 AM episode.   The patient conveys that he is missed multiple recent doses of Bacillus diltiazem as well as his metoprolol, and conveys that he intermittently misses doses of his Xarelto.  In [place] long ED, heart rates noted to still be in the 160s to 180s.  Diltiazem drip started, with improvement in heart rates into the 120s to 130s.  Blood pressure is tolerated with the elevated heart rates as well as diltiazem drip, without any evidence of hypotension.  EKG reportedly demonstrates A-fib with RVR, without any reported evidence of ST changes.  Chest x-ray, We will final read is currently pending, reportedly showed no overt evidence of acute process.  Presenting labs notable for serum potassium 3.8, magnesium 1.9.  I have placed an order for observation to stepdown unit.   I have placed some additional preliminary admit orders via the adult multi-morbid admission order set. I have also ordered TSH, potassium chloride 40 mEq po x 1, magnesium sulfate 2 g IV, and have resumed his home Xarelto.    Babs Bertin,  DO Hospitalist
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
## 2022-09-04 NOTE — ED Notes (Signed)
Pt in bed, pt denies pain, pt denies pain at former iv site in L ac, no redness noted, denies pain there.
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
## 2022-09-04 NOTE — ED Notes (Signed)
Pt in bed, pt denies pain, resps even and unlabored.  Pt has no requests at this time.  

## 2022-09-05 DIAGNOSIS — I4891 Unspecified atrial fibrillation: Secondary | ICD-10-CM | POA: Diagnosis not present

## 2022-09-05 NOTE — Discharge Summary (Signed)
Physician Discharge Summary   Jeffery Nielsen WIO:[id number] DOB: 18-Dec-1965 DOA: 09/04/2022  PCP: Lind Covert, MD  Admit date: 09/04/2022 Discharge date: 09/05/2022  Barriers to discharge: none  Admitted From: Home Disposition:  Home Discharging physician: Dwyane Dee, MD  Recommendations for Outpatient Follow-up:  Follow up with cardiology as needed  Home Health:  Equipment/Devices:   Discharge Condition: stable CODE STATUS: Full Diet recommendation:  Diet Orders (From admission, onward)     Start     Ordered   09/05/22 0000  Diet - low sodium heart healthy        09/05/22 1156   09/04/22 0624  Diet Heart Room service appropriate? Yes; Fluid consistency: Thin  Diet effective now       Question Answer Comment  Room service appropriate? Yes   Fluid consistency: Thin      09/04/22 0623            Hospital Course:  Atrial fibrillation with RVR ([place]) -Patient endorsed missing a couple days of Cardizem and metoprolol -Importance of ongoing compliance going forward emphasized.  Patient voiced understanding -Resuming Cardizem and metoprolol on admission, patient converted back to NSR -Echo repeated on admission: EF 60 to 65%, no RWMA, indeterminate diastolic parameters.   Hypertension Resume Cardizem 240 mg p.o. daily. Resume metoprolol succinate 25 mg p.o. daily. Resume lisinopril 20 mg p.o. twice daily.   OSA (obstructive sleep apnea) Not on CPAP. Unable to tolerate the mask. Would benefit from using it at home.   Hyperglycemia - CBC 119 on repeat   Obesity  Weight and BMI still not updated. Needs lifestyle modifications. Follow-up with primary care provider as an outpatient.    The patient's chronic medical conditions were treated accordingly per the patient's home medication regimen except as noted.  On day of discharge, patient was felt deemed stable for discharge. Patient/family member advised to call PCP or come back to ER if needed.    Principal Diagnosis: Atrial fibrillation with RVR [name])  Discharge Diagnoses: Active Hospital Problems   Diagnosis Date Noted   Atrial fibrillation with RVR ([place]) 03/18/2020   Hyperglycemia 09/04/2022   OSA (obstructive sleep apnea) 12/01/2018   OBESITY 12/01/2009   Hypertension 01/23/2007    Resolved Hospital Problems  No resolved problems to display.     Discharge Instructions     Amb referral to AFIB Clinic   Complete by: As directed    Diet - low sodium heart healthy   Complete by: As directed       Allergies as of 09/05/2022       Reactions   Amoxil [amoxicillin] Anaphylaxis   Did it involve swelling of the face/tongue/throat, SOB, or low BP? Yes Did it involve sudden or severe rash/hives, skin peeling, or any reaction on the inside of your mouth or nose? No Did you need to seek medical attention at a hospital or doctor's office? Yes When did it last happen?      childhood  If all above answers are "NO", may proceed with cephalosporin use.   Penicillins Anaphylaxis        Medication List     TAKE these medications    acetaminophen 650 MG CR tablet Commonly known as: TYLENOL Take 1,300 mg by mouth daily as needed for pain.   diclofenac Sodium 1 % Gel Commonly known as: Voltaren Apply 4 g topically 4 (four) times daily as needed. What changed:  how much to take when to take this reasons to take this  diltiazem 240 MG 24 hr capsule Commonly known as: CARDIZEM CD TAKE 1 CAPSULE BY MOUTH EVERY DAY What changed: how much to take   enalapril 20 MG tablet Commonly known as: VASOTEC TAKE 1 TABLET BY MOUTH TWICE A DAY   fluticasone 50 MCG/ACT nasal spray Commonly known as: FLONASE Place 2 sprays into both nostrils daily as needed for allergies.   hydrochlorothiazide 12.5 MG capsule Commonly known as: MICROZIDE Take 2 capsules (25 mg total) by mouth daily.   metoprolol succinate 25 MG 24 hr tablet Commonly known as: TOPROL-XL TAKE 1 TABLET BY  MOUTH DAILY.   OVER THE COUNTER MEDICATION Take 1 each by mouth daily. Beet Root Gummy supplement   Xarelto 20 MG Tabs tablet Generic drug: rivaroxaban TAKE 1 TABLET BY MOUTH DAILY WITH SUPPER. What changed: how much to take        Allergies  Allergen Reactions   Amoxil [Amoxicillin] Anaphylaxis    Did it involve swelling of the face/tongue/throat, SOB, or low BP? Yes Did it involve sudden or severe rash/hives, skin peeling, or any reaction on the inside of your mouth or nose? No Did you need to seek medical attention at a hospital or doctor's office? Yes When did it last happen?      childhood  If all above answers are "NO", may proceed with cephalosporin use.    Penicillins Anaphylaxis    Consultations:   Procedures:   Discharge Exam: BP 126/78 (BP Location: Left Arm)   Pulse 80   Temp 97.8 F (36.6 C) (Oral)   Resp 17   Ht 5\' 11"  (1.803 m)   Wt 121.1 kg   SpO2 95%   BMI 37.24 kg/m  Physical Exam Constitutional:      General: He is not in acute distress.    Appearance: Normal appearance.  HENT:     Head: Normocephalic and atraumatic.     Mouth/Throat:     Mouth: Mucous membranes are moist.  Eyes:     Extraocular Movements: Extraocular movements intact.  Cardiovascular:     Rate and Rhythm: Normal rate and regular rhythm.     Heart sounds: Normal heart sounds.  Pulmonary:     Effort: Pulmonary effort is normal. No respiratory distress.     Breath sounds: Normal breath sounds. No wheezing.  Abdominal:     General: Bowel sounds are normal. There is no distension.     Palpations: Abdomen is soft.     Tenderness: There is no abdominal tenderness.  Musculoskeletal:        General: Normal range of motion.     Cervical back: Normal range of motion and neck supple.  Skin:    General: Skin is warm and dry.  Neurological:     General: No focal deficit present.     Mental Status: He is alert.  Psychiatric:        Mood and Affect: Mood normal.         Behavior: Behavior normal.      The results of significant diagnostics from this hospitalization (including imaging, microbiology, ancillary and laboratory) are listed below for reference.   Microbiology: No results found for this or any previous visit (from the past 240 hour(s)).   Labs: BNP (last 3 results) No results for input(s): "BNP" in the last 8760 hours. Basic Metabolic Panel: Recent Labs  Lab 09/04/22 0420  NA 138  K 3.8  CL 102  CO2 27  GLUCOSE 119*  BUN 13  CREATININE 1.03  CALCIUM 9.3  MG 1.9   Liver Function Tests: No results for input(s): "AST", "ALT", "ALKPHOS", "BILITOT", "PROT", "ALBUMIN" in the last 168 hours. No results for input(s): "LIPASE", "AMYLASE" in the last 168 hours. No results for input(s): "AMMONIA" in the last 168 hours. CBC: Recent Labs  Lab 09/04/22 0420  WBC 10.5  HGB 16.2  HCT 48.1  MCV 89.7  PLT 285   Cardiac Enzymes: No results for input(s): "CKTOTAL", "CKMB", "CKMBINDEX", "TROPONINI" in the last 168 hours. BNP: Invalid input(s): "POCBNP" CBG: No results for input(s): "GLUCAP" in the last 168 hours. D-Dimer No results for input(s): "DDIMER" in the last 72 hours. Hgb A1c No results for input(s): "HGBA1C" in the last 72 hours. Lipid Profile No results for input(s): "CHOL", "HDL", "LDLCALC", "TRIG", "CHOLHDL", "LDLDIRECT" in the last 72 hours. Thyroid function studies Recent Labs    09/04/22 0700  TSH 0.896   Anemia work up No results for input(s): "VITAMINB12", "FOLATE", "FERRITIN", "TIBC", "IRON", "RETICCTPCT" in the last 72 hours. Urinalysis    Component Value Date/Time   COLORURINE YELLOW 05/14/2022 1022   APPEARANCEUR CLEAR 05/14/2022 1022   LABSPEC 1.023 05/14/2022 1022   PHURINE 5.0 05/14/2022 1022   GLUCOSEU NEGATIVE 05/14/2022 1022   HGBUR NEGATIVE 05/14/2022 1022   HGBUR negative 12/01/2009 0917   BILIRUBINUR NEGATIVE 05/14/2022 1022   KETONESUR NEGATIVE 05/14/2022 1022   PROTEINUR NEGATIVE 05/14/2022  1022   UROBILINOGEN 0.2 12/01/2009 0917   NITRITE NEGATIVE 05/14/2022 1022   LEUKOCYTESUR TRACE (A) 05/14/2022 1022   Sepsis Labs Recent Labs  Lab 09/04/22 0420  WBC 10.5   Microbiology No results found for this or any previous visit (from the past 240 hour(s)).  Procedures/Studies: ECHOCARDIOGRAM COMPLETE  Result Date: 09/04/2022    ECHOCARDIOGRAM REPORT   Patient Name:   Jeffery Nielsen Date of Exam: 09/04/2022 Medical Rec #:  [id number]  Height:       71.0 in Accession #:    [id number] Weight:       269.0 lb Date of Birth:  08/29/1966 BSA:          2.392 m Patient Age:    55 years   BP:           125/73 mmHg Patient Gender: M          HR:           59 bpm. Exam Location:  Inpatient Procedure: 2D Echo Indications:    Atrial Fibrillation  History:        Patient has prior history of Echocardiogram examinations, most                 recent 03/31/2018. Arrythmias:Atrial Fibrillation; Risk                 Factors:Hypertension.  Sonographer:    Cathie Hoops Referring Phys: [id number] Binyomin Brann MANUEL ORTIZ IMPRESSIONS  1. Left ventricular ejection fraction, by estimation, is 60 to 65%. The left ventricle has normal function. The left ventricle has no regional wall motion abnormalities. Left ventricular diastolic parameters are indeterminate.  2. Right ventricular systolic function is normal. The right ventricular size is normal.  3. The mitral valve is normal in structure. No evidence of mitral valve regurgitation. No evidence of mitral stenosis.  4. The aortic valve is tricuspid. Aortic valve regurgitation is not visualized. No aortic stenosis is present.  5. The inferior vena cava is normal in size with greater than 50% respiratory variability, suggesting right atrial pressure of 3 mmHg. Comparison(s):  No significant change from prior study. FINDINGS  Left Ventricle: Left ventricular ejection fraction, by estimation, is 60 to 65%. The left ventricle has normal function. The left ventricle has no regional wall  motion abnormalities. The left ventricular internal cavity size was normal in size. There is  no left ventricular hypertrophy. Left ventricular diastolic parameters are indeterminate. Right Ventricle: The right ventricular size is normal. No increase in right ventricular wall thickness. Right ventricular systolic function is normal. Left Atrium: Left atrial size was normal in size. Right Atrium: Right atrial size was normal in size. Pericardium: There is no evidence of pericardial effusion. Mitral Valve: The mitral valve is normal in structure. No evidence of mitral valve regurgitation. No evidence of mitral valve stenosis. Tricuspid Valve: The tricuspid valve is normal in structure. Tricuspid valve regurgitation is not demonstrated. No evidence of tricuspid stenosis. Aortic Valve: The aortic valve is tricuspid. Aortic valve regurgitation is not visualized. No aortic stenosis is present. Aortic valve mean gradient measures 2.0 mmHg. Aortic valve peak gradient measures 4.2 mmHg. Aortic valve area, by VTI measures 3.13 cm. Pulmonic Valve: The pulmonic valve was not well visualized. Pulmonic valve regurgitation is not visualized. No evidence of pulmonic stenosis. Aorta: The aortic root and ascending aorta are structurally normal, with no evidence of dilitation. Venous: The inferior vena cava is normal in size with greater than 50% respiratory variability, suggesting right atrial pressure of 3 mmHg. IAS/Shunts: No atrial level shunt detected by color flow Doppler.  LEFT VENTRICLE PLAX 2D LVIDd:         4.30 cm     Diastology LVIDs:         2.90 cm     LV e' medial:    8.38 cm/s LV PW:         1.00 cm     LV E/e' medial:  11.6 LV IVS:        1.00 cm     LV e' lateral:   10.10 cm/s LVOT diam:     1.90 cm     LV E/e' lateral: 9.6 LV SV:         63 LV SV Index:   26 LVOT Area:     2.84 cm  LV Volumes (MOD) LV vol d, MOD A2C: 85.0 ml LV vol d, MOD A4C: 72.9 ml LV vol s, MOD A2C: 31.7 ml LV vol s, MOD A4C: 27.1 ml LV SV MOD  A2C:     53.3 ml LV SV MOD A4C:     72.9 ml LV SV MOD BP:      49.0 ml RIGHT VENTRICLE RV Basal diam:  3.30 cm RV Mid diam:    2.80 cm RV S prime:     12.20 cm/s TAPSE (M-mode): 1.9 cm LEFT ATRIUM             Index        RIGHT ATRIUM           Index LA diam:        3.30 cm 1.38 cm/m   RA Area:     10.70 cm LA Vol (A2C):   39.2 ml 16.39 ml/m  RA Volume:   24.50 ml  10.24 ml/m LA Vol (A4C):   29.4 ml 12.29 ml/m LA Biplane Vol: 33.9 ml 14.17 ml/m  AORTIC VALVE                    PULMONIC VALVE AV Area (Vmax):    3.00 cm  PV Vmax:       0.88 m/s AV Area (Vmean):   2.72 cm     PV Peak grad:  3.1 mmHg AV Area (VTI):     3.13 cm AV Vmax:           102.00 cm/s AV Vmean:          67.900 cm/s AV VTI:            0.200 m AV Peak Grad:      4.2 mmHg AV Mean Grad:      2.0 mmHg LVOT Vmax:         108.00 cm/s LVOT Vmean:        65.100 cm/s LVOT VTI:          0.221 m LVOT/AV VTI ratio: 1.10  AORTA Ao Root diam: 3.30 cm Ao Asc diam:  2.90 cm MITRAL VALVE MV Area (PHT): 4.15 cm    SHUNTS MV Decel Time: 183 msec    Systemic VTI:  0.22 m MV E velocity: 96.80 cm/s  Systemic Diam: 1.90 cm MV A velocity: 33.80 cm/s MV E/A ratio:  2.86 Riley LamMahesh Chandrasekhar MD Electronically signed by Riley LamMahesh Chandrasekhar MD Signature Date/Time: 09/04/2022/11:51:27 AM    Final    DG Chest Port 1 View  Result Date: 09/04/2022 CLINICAL DATA:  Tachycardia. EXAM: PORTABLE CHEST 1 VIEW COMPARISON:  05/14/2022 FINDINGS: The lungs are clear without focal pneumonia, edema, pneumothorax or pleural effusion. The cardiopericardial silhouette is within normal limits for size. The visualized bony structures of the thorax are unremarkable. Telemetry leads overlie the chest. IMPRESSION: No active disease. Electronically Signed   By: Kennith CenterEric  Mansell M.D.   On: 09/04/2022 06:25     Time coordinating discharge: Over 30 minutes    Lewie Chamberavid Lilyona Richner, MD  [facility] 09/05/2022, 6:53 PM

## 2022-09-05 NOTE — Progress Notes (Signed)
  Transition of Care [facility]) Screening Note   Patient Details  Name: Jeffery Nielsen Date of Birth: [birth year]   Transition of Care [facility]) CM/SW Contact:    Dessa Phi, RN Phone Number: 09/05/2022, 11:39 AM    Transition of Care Department [facility]) has reviewed patient and no TOC needs have been identified at this time. We will continue to monitor patient advancement through interdisciplinary progression rounds. If new patient transition needs arise, please place a TOC consult.

## 2022-09-06 ENCOUNTER — Telehealth: Payer: Self-pay

## 2022-09-06 NOTE — Patient Outreach (Signed)
  Care Coordination TOC Note Transition Care Management Unsuccessful Follow-up Telephone Call  Date of discharge and from where:  [facility] 09/04/22-09/05/22  Attempts:  1st Attempt  Reason for unsuccessful TCM follow-up call:  Unable to leave message-unidentified voicemail  Johnney Killian, RN, BSN, CCM Care Management Coordinator [facility] Phone: [phone]: [phone]

## 2022-09-07 ENCOUNTER — Telehealth: Payer: Self-pay

## 2022-09-07 NOTE — Patient Outreach (Signed)
  Care Coordination TOC Note Transition Care Management Unsuccessful Follow-up Telephone Call  Date of discharge and from where:  [facility] 09/05/22  Attempts:  2nd Attempt  Reason for unsuccessful TCM follow-up call:  No answer/busy  Johnney Killian, RN, BSN, CCM Care Management Coordinator [facility] Phone: [phone]: [phone]

## 2022-09-21 ENCOUNTER — Encounter (HOSPITAL_COMMUNITY): Payer: Self-pay

## 2022-09-21 ENCOUNTER — Inpatient Hospital Stay (HOSPITAL_COMMUNITY): Admission: RE | Admit: 2022-09-21 | Payer: 59 | Source: Ambulatory Visit | Admitting: Physician Assistant

## 2022-09-25 ENCOUNTER — Encounter: Payer: Self-pay | Admitting: Family Medicine

## 2022-09-25 ENCOUNTER — Encounter (HOSPITAL_COMMUNITY): Payer: Self-pay | Admitting: Physician Assistant

## 2022-09-25 ENCOUNTER — Ambulatory Visit (HOSPITAL_COMMUNITY)
Admission: RE | Admit: 2022-09-25 | Discharge: 2022-09-25 | Disposition: A | Discharge status: Home / Self Care | Payer: 59 | Source: Ambulatory Visit | Attending: Physician Assistant | Admitting: Physician Assistant

## 2022-09-25 VITALS — BP 138/92 | HR 80 | Ht 71.0 in | Wt 262.2 lb

## 2022-09-25 DIAGNOSIS — Z8249 Family history of ischemic heart disease and other diseases of the circulatory system: Secondary | ICD-10-CM | POA: Insufficient documentation

## 2022-09-25 DIAGNOSIS — I480 Paroxysmal atrial fibrillation: Secondary | ICD-10-CM | POA: Diagnosis not present

## 2022-09-25 DIAGNOSIS — G4733 Obstructive sleep apnea (adult) (pediatric): Secondary | ICD-10-CM | POA: Insufficient documentation

## 2022-09-25 DIAGNOSIS — Z7901 Long term (current) use of anticoagulants: Secondary | ICD-10-CM | POA: Insufficient documentation

## 2022-09-25 DIAGNOSIS — Z79899 Other long term (current) drug therapy: Secondary | ICD-10-CM | POA: Insufficient documentation

## 2022-09-25 DIAGNOSIS — E669 Obesity, unspecified: Secondary | ICD-10-CM | POA: Diagnosis not present

## 2022-09-25 DIAGNOSIS — I10 Essential (primary) hypertension: Secondary | ICD-10-CM | POA: Insufficient documentation

## 2022-09-25 DIAGNOSIS — Z6836 Body mass index (BMI) 36.0-36.9, adult: Secondary | ICD-10-CM | POA: Diagnosis not present

## 2022-09-25 MED ORDER — DILTIAZEM HCL 30 MG PO TABS: ORAL_TABLET | ORAL | 1 refills | Status: DC

## 2022-09-25 NOTE — Patient Instructions (Signed)
Cardizem 30mg -- Take 1 tablet every 4 hours AS NEEDED for AFIB heart rate >100 as long as top BP >100.    
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
## 2022-10-05 ENCOUNTER — Other Ambulatory Visit: Payer: Self-pay

## 2022-10-05 ENCOUNTER — Ambulatory Visit: Payer: 59 | Admitting: Family Medicine

## 2022-10-05 VITALS — BP 137/83 | HR 95 | Wt 264.8 lb

## 2022-10-05 DIAGNOSIS — Z23 Encounter for immunization: Secondary | ICD-10-CM | POA: Diagnosis not present

## 2022-10-05 DIAGNOSIS — M25561 Pain in right knee: Secondary | ICD-10-CM | POA: Diagnosis not present

## 2022-10-05 NOTE — Progress Notes (Unsigned)
    SUBJECTIVE:   CHIEF COMPLAINT / HPI:   Patient presents for knee pain. States he was washing the RV Monday or Tuesday. Usually when does yardwork will get sore but not to the point where he couldn't walk on it for 2 days. No injury, trauma. No swelling. Took Tylenol arthritis for pain and used some topical cream but at the time didn't feel it helped. Interupted his sleep and states the pain was unbearable. Currently today his pain went away.   Health maintenance Due for Tdap, shingrix and flu vaccine   PERTINENT  PMH / PSH: Reviewed   OBJECTIVE:   BP 137/83   Pulse 95   Wt 264 lb 12.8 oz (120.1 kg)   SpO2 97%   BMI 36.93 kg/m    Physical exam General: well appearing, NAD Cardiovascular: RRR, no murmurs Lungs: CTAB. Normal WOB Abdomen: soft, non-distended, non-tender Skin: warm, dry. No edema  *** Knee: - Inspection: no gross deformity b/l. No swelling/effusion, erythema or bruising b/l. Skin intact - Palpation: no TTP b/l - ROM: full active ROM with flexion and extension in knee and hip b/l - Strength: 5/5 strength b/l - Neuro/vasc: NV intact distally b/l - Special Tests: - LIGAMENTS: negative anterior and posterior drawer, negative Lachman's, no MCL or LCL laxity  -- MENISCUS: negative McMurray's, negative Thessaly  -- PF JOINT: nml patellar mobility bilaterally.  negative patellar grind, negative patellar apprehension  Hips: normal ROM, negative FABER and FADIR bilaterally   ASSESSMENT/PLAN:   No problem-specific Assessment & Plan notes found for this encounter.     Cora Collum, DO [facility]

## 2022-10-05 NOTE — Patient Instructions (Signed)
It was great seeing you today!  You came in for your knee but I am glad it is feeling better!  Continue Tylenol as needed, stretches, and alternating between ice and heat as needed.  Return if the symptoms you were experiencing returns.   Today you also got your Tdap which is good for 10 years.    Feel free to call with any questions or concerns at any time, at [phone].   Take care,  Dr. Cora Collum [facility]

## 2022-10-07 NOTE — Progress Notes (Incomplete)
    SUBJECTIVE:   CHIEF COMPLAINT / HPI:   Patient presents for knee pain. States he was washing the RV Monday or Tuesday when it started to hurt. Usually when does yardwork will get sore but not to the point where he couldn't walk on it for 2 days. No injury, trauma. No swelling. Took Tylenol arthritis for pain and used some topical cream but at the time didn't feel it helped. Interupted his sleep and states the pain was unbearable. Currently today his pain went away.   Health maintenance Due for Tdap, shingrix and flu vaccine   PERTINENT  PMH / PSH: Reviewed   OBJECTIVE:   BP 137/83   Pulse 95   Wt 264 lb 12.8 oz (120.1 kg)   SpO2 97%   BMI 36.93 kg/m    Physical exam General: well appearing, NAD Cardiovascular: RRR, no murmurs Lungs: CTAB. Normal WOB Abdomen: soft, non-distended, non-tender Skin: warm, dry. No edema MSK R Knee: - Inspection: no gross deformity b/l. No swelling/effusion, erythema or bruising b/l. Skin intact - Palpation: no TTP b/l - ROM: full active ROM with flexion and extension in knee and hip b/l - Strength: 5/5 strength b/l - Neuro/vasc: NV intact distally b/l - Special Tests: - LIGAMENTS: negative anterior and posterior drawer, negative Lachman's, no MCL or LCL laxity  -- MENISCUS: negative McMurray's   ASSESSMENT/PLAN:   No problem-specific Assessment & Plan notes found for this encounter.   R knee pain Occurring for about 3-4 days to the point he could not bear weight. No inciting injury. Today does not feel the pain. Physical exam unremarkable. Potentially could have had a meniscal injury but unclear from exam. Discussed conservative management and advised him to return if the pain returns. Patient agreeable with plan.   Health maintenance Received Tdap vaccine   Cora Collum, DO [facility]

## 2022-10-07 NOTE — Assessment & Plan Note (Signed)
Occurring for about 3-4 days to the point he could not bear weight. No inciting injury. Different from R knee pain several months ago in which he had tendonitis. Today does not feel the pain. Physical exam unremarkable. Potentially could have had a meniscal injury but unclear from exam. Discussed conservative management and advised him to follow up if the pain returns. Patient agreeable with plan.

## 2022-10-21 ENCOUNTER — Other Ambulatory Visit: Payer: Self-pay | Admitting: Cardiology

## 2022-11-08 ENCOUNTER — Other Ambulatory Visit: Payer: Self-pay | Admitting: Internal Medicine

## 2022-11-08 NOTE — Telephone Encounter (Signed)
Prescription refill request for Xarelto received.  Indication: PAF Last office visit: 09/25/22 C Fenton PA Weight: 118.9kg Age: 56 Scr: 1.03 on 09/04/22 CrCl: 131.47  Based on above findings Xarelto 20mg  daily is the appropriate dose.  Refill approved.

## 2022-12-12 ENCOUNTER — Other Ambulatory Visit: Payer: Self-pay | Admitting: Internal Medicine

## 2022-12-13 ENCOUNTER — Other Ambulatory Visit: Payer: Self-pay | Admitting: Family Medicine

## 2022-12-13 DIAGNOSIS — I10 Essential (primary) hypertension: Secondary | ICD-10-CM

## 2022-12-14 MED ORDER — METOPROLOL SUCCINATE ER 25 MG PO TB24: 25.0000 mg | ORAL_TABLET | Freq: Every day | ORAL | 1 refills | Status: DC

## 2022-12-14 NOTE — Addendum Note (Signed)
Addended by: Juluis Mire on: 12/14/2022 11:02 AM   Modules accepted: Orders

## 2022-12-28 ENCOUNTER — Ambulatory Visit (INDEPENDENT_AMBULATORY_CARE_PROVIDER_SITE_OTHER): Payer: 59 | Admitting: Family Medicine

## 2022-12-28 ENCOUNTER — Other Ambulatory Visit: Payer: Self-pay

## 2022-12-28 ENCOUNTER — Encounter: Payer: Self-pay | Admitting: Family Medicine

## 2022-12-28 VITALS — BP 145/81 | HR 88 | Ht 71.0 in | Wt 275.6 lb

## 2022-12-28 DIAGNOSIS — I480 Paroxysmal atrial fibrillation: Secondary | ICD-10-CM | POA: Diagnosis not present

## 2022-12-28 DIAGNOSIS — J069 Acute upper respiratory infection, unspecified: Secondary | ICD-10-CM | POA: Diagnosis not present

## 2022-12-28 DIAGNOSIS — R052 Subacute cough: Secondary | ICD-10-CM

## 2022-12-28 DIAGNOSIS — J4520 Mild intermittent asthma, uncomplicated: Secondary | ICD-10-CM

## 2022-12-28 LAB — POC SOFIA 2 FLU + SARS ANTIGEN FIA
Influenza A, POC: NEGATIVE
Influenza B, POC: NEGATIVE
SARS Coronavirus 2 Ag: NEGATIVE

## 2022-12-28 MED ORDER — ALBUTEROL SULFATE HFA 108 (90 BASE) MCG/ACT IN AERS: 2.0000 | INHALATION_SPRAY | Freq: Four times a day (QID) | RESPIRATORY_TRACT | 0 refills | Status: AC | PRN

## 2022-12-28 MED ORDER — BUDESONIDE 180 MCG/ACT IN AEPB: 2.0000 | INHALATION_SPRAY | Freq: Two times a day (BID) | RESPIRATORY_TRACT | 0 refills | Status: DC | PRN

## 2022-12-28 NOTE — Patient Instructions (Signed)
It was wonderful to see you today.  Please bring ALL of your medications with you to every visit.   Today we talked about:  Cough - You most likely have a viral illness causing your cough plus it has caused your asthma to flair up. I have prescribed a steroid inhaler. Please use this twice a day for about a month while you are still having symptoms. You can use albuterol on top of that if needed.   Heart rate - It is stable here. It is ok for your heart rate to be elevated. If it goes up to about 150-160 resting then please call. Otherwise it is ok.   Thank you for choosing [facility].   Please call [phone] with any questions about today's appointment.  Please be sure to schedule follow up at the front desk before you leave today.   Lowry Ram, MD  Family Medicine

## 2022-12-28 NOTE — Progress Notes (Unsigned)
    SUBJECTIVE:   CHIEF COMPLAINT / HPI: Cough, Tachycardia   Patient has had productive cough for over a week. He feels like his heart is racing. He has an at home EKG and it says his heart rate is up. He has taken his as needed Cardizem twice within the last two weeks for tachycardia. He says this did resolve his tachycardia. Denies fevers. Does endorse some dyspnea while resting and worse on exertion during this illness as well. No sick contacts that he knows of. No vomiting or diarrhea.  Has tried Mucinex which has helped some. He says this helped his congestion, but the congestion came right back.  Has not used sudafed or other OTC medicines.  No pleuritic pain.   Last time he used an inhaler was a year ago.   PERTINENT  PMH / PSH: Afib, HTN, Asthma   OBJECTIVE:   BP (!) 145/81   Pulse 88   Ht 5\' 11"  (1.803 m)   Wt 275 lb 9.6 oz (125 kg)   SpO2 97%   BMI 38.44 kg/m   General: well appearing, in no acute distress CV: RRR, radial pulses equal and palpable, no BLE edema  Resp: Normal work of breathing on room air, wheezing in upper toe middle left lung fields, otherwise no crackles, rales  Abd: Soft, non tender, non distended  Neuro: Alert & Oriented x 4   Flu/COVID test negative  ASSESSMENT/PLAN:   Viral URI with cough Most likely viral URI that has been exacerbated by his asthma. Avoid OTC medications with phenylephrine due to atrial fibrillation.  - Steroid inhaler  - albuterol as needed  - flonase spray   Atrial fibrillation ([place]) Patient is rate controlled at clinic. Told patient some tachycardia can be tolerated. As long as he does not feel light headed or dizzy.  - Continue dilt 240 mg daily and 30 mg as needed      Lowry Ram, MD [place]

## 2023-01-01 DIAGNOSIS — J069 Acute upper respiratory infection, unspecified: Secondary | ICD-10-CM | POA: Insufficient documentation

## 2023-01-01 NOTE — Assessment & Plan Note (Signed)
Patient is rate controlled at clinic. Told patient some tachycardia can be tolerated. As long as he does not feel light headed or dizzy.  - Continue dilt 240 mg daily and 30 mg as needed

## 2023-01-01 NOTE — Assessment & Plan Note (Signed)
Most likely viral URI that has been exacerbated by his asthma. Avoid OTC medications with phenylephrine due to atrial fibrillation.  - Steroid inhaler  - albuterol as needed  - flonase spray

## 2023-01-03 ENCOUNTER — Encounter (HOSPITAL_COMMUNITY): Payer: Self-pay | Admitting: *Deleted

## 2023-01-05 ENCOUNTER — Other Ambulatory Visit: Payer: Self-pay | Admitting: Family Medicine

## 2023-01-05 DIAGNOSIS — I10 Essential (primary) hypertension: Secondary | ICD-10-CM

## 2023-01-16 ENCOUNTER — Other Ambulatory Visit: Payer: Self-pay | Admitting: Family Medicine

## 2023-01-18 ENCOUNTER — Other Ambulatory Visit: Payer: Self-pay | Admitting: Family Medicine

## 2023-01-21 MED ORDER — ENALAPRIL MALEATE 20 MG PO TABS: 20.0000 mg | ORAL_TABLET | Freq: Two times a day (BID) | ORAL | 2 refills | Status: DC

## 2023-01-21 NOTE — Addendum Note (Signed)
Addended by: Dorna Bloom on: 01/21/2023 05:04 PM   Modules accepted: Orders

## 2023-02-14 ENCOUNTER — Ambulatory Visit: Payer: 59 | Attending: Cardiovascular Disease | Admitting: Cardiovascular Disease

## 2023-02-14 ENCOUNTER — Encounter: Payer: Self-pay | Admitting: Cardiovascular Disease

## 2023-02-14 VITALS — BP 118/76 | HR 81 | Ht 71.0 in | Wt 280.4 lb

## 2023-02-14 DIAGNOSIS — I10 Essential (primary) hypertension: Secondary | ICD-10-CM

## 2023-02-14 DIAGNOSIS — I480 Paroxysmal atrial fibrillation: Secondary | ICD-10-CM

## 2023-02-14 MED ORDER — DILTIAZEM HCL 30 MG PO TABS: ORAL_TABLET | ORAL | 1 refills | Status: AC

## 2023-02-14 MED ORDER — DILTIAZEM HCL ER COATED BEADS 240 MG PO CP24: ORAL_CAPSULE | ORAL | 3 refills | Status: DC

## 2023-02-14 MED ORDER — METOPROLOL SUCCINATE ER 25 MG PO TB24: 25.0000 mg | ORAL_TABLET | Freq: Every day | ORAL | 1 refills | Status: DC
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
## 2023-02-14 NOTE — Patient Instructions (Addendum)
Medication Instructions:  Your physician recommends that you continue on your current medications as directed. Please refer to the Current Medication list given to you today.   *If you need a refill on your cardiac medications before your next appointment, please call your pharmacy*   Lab Work: 1 week prior to CT scan: CBC, BMET (you do not need to be fasting) You may come to the lab any time between 7:30am and 4:30pm. If you have labs (blood work) drawn today and your tests are completely normal, you will receive your results only by: [place] (if you have MyChart) OR A paper copy in the mail If you have any lab test that is abnormal or we need to change your treatment, we will call you to review the results.   Testing/Procedures: Your physician has requested that you have cardiac CT. Cardiac computed tomography (CT) is a painless test that uses an x-ray machine to take clear, detailed pictures of your heart. For further information please visit [email or web address]. Please follow instruction sheet as given.   Your physician has recommended that you have an ablation. Catheter ablation is a medical procedure used to treat some cardiac arrhythmias (irregular heartbeats). During catheter ablation, a long, thin, flexible tube is put into a blood vessel in your groin (upper thigh), or neck. This tube is called an ablation catheter. It is then guided to your heart through the blood vessel. Radio frequency waves destroy small areas of heart tissue where abnormal heartbeats may cause an arrhythmia to start.    The EP procedure scheduler, April G, will call you to schedule your procedure and review instructions with you. Please allow 2-4 weeks for her to contact you.     Follow-Up: At [facility], you and your health needs are our priority.  As part of our continuing mission to provide you with exceptional heart care, we have created designated Provider Care Teams.  These Care Teams  include your primary Cardiologist (physician) and Advanced Practice Providers (APPs -  Physician Assistants and Nurse Practitioners) who all work together to provide you with the care you need, when you need it.   Your next appointment:   4 week(s) after your ablation   Provider:   You will follow up in the [facility] located at [facility]. Your provider will be: Clint R. Fenton, PA-C A-C

## 2023-02-19 ENCOUNTER — Encounter: Payer: Self-pay | Admitting: Cardiovascular Disease

## 2023-02-22 ENCOUNTER — Other Ambulatory Visit: Payer: Self-pay

## 2023-02-22 ENCOUNTER — Telehealth: Payer: Self-pay | Admitting: Cardiovascular Disease

## 2023-02-22 DIAGNOSIS — I480 Paroxysmal atrial fibrillation: Secondary | ICD-10-CM

## 2023-02-22 DIAGNOSIS — Z01818 Encounter for other preprocedural examination: Secondary | ICD-10-CM

## 2023-02-22 NOTE — Telephone Encounter (Signed)
Patient states he was returning a call from [place].  Will forward to Betsey Holiday, RN to return call to patient.

## 2023-02-22 NOTE — Telephone Encounter (Signed)
Spoke with patient about ablation scheduling, no further needs at this time.

## 2023-02-22 NOTE — Telephone Encounter (Signed)
Pt states he is returning a call, unsure of who called

## 2023-03-09 NOTE — Progress Notes (Signed)
Pt only wanted BP check. Pt has PCP

## 2023-03-11 ENCOUNTER — Ambulatory Visit: Payer: 59 | Attending: Cardiovascular Disease | Admitting: Cardiovascular Disease

## 2023-03-11 ENCOUNTER — Encounter: Payer: Self-pay | Admitting: Cardiovascular Disease

## 2023-03-11 VITALS — BP 140/84 | HR 85 | Ht 71.0 in | Wt 280.0 lb

## 2023-03-11 DIAGNOSIS — G4733 Obstructive sleep apnea (adult) (pediatric): Secondary | ICD-10-CM

## 2023-03-11 DIAGNOSIS — I480 Paroxysmal atrial fibrillation: Secondary | ICD-10-CM

## 2023-03-11 DIAGNOSIS — J4520 Mild intermittent asthma, uncomplicated: Secondary | ICD-10-CM

## 2023-03-11 DIAGNOSIS — I10 Essential (primary) hypertension: Secondary | ICD-10-CM

## 2023-03-11 DIAGNOSIS — Z7901 Long term (current) use of anticoagulants: Secondary | ICD-10-CM
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
## 2023-03-11 NOTE — Patient Instructions (Signed)
Medication Instructions:  Continue same medications *If you need a refill on your cardiac medications before your next appointment, please call your pharmacy*   Lab Work: None ordered   Testing/Procedures: None ordered   Follow-Up: At [facility], you and your health needs are our priority.  As part of our continuing mission to provide you with exceptional heart care, we have created designated Provider Care Teams.  These Care Teams include your primary Cardiologist (physician) and Advanced Practice Providers (APPs -  Physician Assistants and Nurse Practitioners) who all work together to provide you with the care you need, when you need it.  We recommend signing up for the patient portal called "MyChart".  Sign up information is provided on this After Visit Summary.  MyChart is used to connect with patients for Virtual Visits (Telemedicine).  Patients are able to view lab/test results, encounter notes, upcoming appointments, etc.  Non-urgent messages can be sent to your provider as well.   To learn more about what you can do with MyChart, go to [email or web address].    Your next appointment:  9 to 12 months      Call in Jan to schedule April appointment     Provider:  [facility]

## 2023-03-16 ENCOUNTER — Encounter: Payer: Self-pay | Admitting: Cardiovascular Disease

## 2023-03-20 ENCOUNTER — Encounter: Payer: Self-pay | Admitting: *Deleted

## 2023-03-20 NOTE — Progress Notes (Signed)
Patient attended 03/09/23 [facility] screening event where his b/p was 148/81, and pt was given his b/p results. At the event, pt did not identify any SDOH insecurities and  confirmed Dr. Deirdre Priest as his PCP, with whom his last appt was 07/17/22. Chart review indicates pt also being followed by cardiology; at his last OV on 02/14/23 with Dr. [name], his b/p was 118/76. In-basket sent to Dr. Deirdre Priest with this information. Pt has upcoming appt with Clint Fenton PA, at the Atrial Fib clinic on 03/26/23. No additional health equity team support indicated at this time.

## 2023-03-26 ENCOUNTER — Ambulatory Visit (HOSPITAL_COMMUNITY)
Admission: RE | Admit: 2023-03-26 | Discharge: 2023-03-26 | Disposition: A | Discharge status: Home / Self Care | Payer: 59 | Source: Ambulatory Visit | Attending: Physician Assistant | Admitting: Physician Assistant

## 2023-03-26 ENCOUNTER — Encounter (HOSPITAL_COMMUNITY): Payer: Self-pay | Admitting: Physician Assistant

## 2023-03-26 VITALS — BP 124/80 | HR 78 | Ht 71.0 in | Wt 281.0 lb

## 2023-03-26 DIAGNOSIS — Z713 Dietary counseling and surveillance: Secondary | ICD-10-CM | POA: Insufficient documentation

## 2023-03-26 DIAGNOSIS — Z7902 Long term (current) use of antithrombotics/antiplatelets: Secondary | ICD-10-CM | POA: Insufficient documentation

## 2023-03-26 DIAGNOSIS — I10 Essential (primary) hypertension: Secondary | ICD-10-CM | POA: Insufficient documentation

## 2023-03-26 DIAGNOSIS — Z6839 Body mass index (BMI) 39.0-39.9, adult: Secondary | ICD-10-CM | POA: Diagnosis not present

## 2023-03-26 DIAGNOSIS — E669 Obesity, unspecified: Secondary | ICD-10-CM | POA: Diagnosis not present

## 2023-03-26 DIAGNOSIS — Z79899 Other long term (current) drug therapy: Secondary | ICD-10-CM | POA: Diagnosis not present

## 2023-03-26 DIAGNOSIS — I480 Paroxysmal atrial fibrillation: Secondary | ICD-10-CM | POA: Diagnosis present

## 2023-03-26 DIAGNOSIS — G4733 Obstructive sleep apnea (adult) (pediatric): Secondary | ICD-10-CM | POA: Diagnosis not present
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
## 2023-04-29 ENCOUNTER — Other Ambulatory Visit: Payer: Self-pay | Admitting: Family Medicine

## 2023-06-27 ENCOUNTER — Emergency Department (HOSPITAL_COMMUNITY): Payer: 59

## 2023-06-27 ENCOUNTER — Other Ambulatory Visit: Payer: Self-pay

## 2023-06-27 ENCOUNTER — Observation Stay (HOSPITAL_COMMUNITY)
Admission: EM | Admit: 2023-06-27 | Discharge: 2023-06-28 | Disposition: A | Discharge status: Home / Self Care | Payer: 59 | Attending: Internal Medicine | Admitting: Internal Medicine

## 2023-06-27 ENCOUNTER — Encounter (HOSPITAL_COMMUNITY): Payer: Self-pay | Admitting: *Deleted

## 2023-06-27 DIAGNOSIS — R079 Chest pain, unspecified: Secondary | ICD-10-CM | POA: Diagnosis present

## 2023-06-27 DIAGNOSIS — Z7901 Long term (current) use of anticoagulants: Secondary | ICD-10-CM | POA: Insufficient documentation

## 2023-06-27 DIAGNOSIS — G4733 Obstructive sleep apnea (adult) (pediatric): Secondary | ICD-10-CM | POA: Insufficient documentation

## 2023-06-27 DIAGNOSIS — I4891 Unspecified atrial fibrillation: Principal | ICD-10-CM | POA: Diagnosis present

## 2023-06-27 DIAGNOSIS — Z79899 Other long term (current) drug therapy: Secondary | ICD-10-CM | POA: Diagnosis not present

## 2023-06-27 DIAGNOSIS — J45909 Unspecified asthma, uncomplicated: Secondary | ICD-10-CM | POA: Insufficient documentation

## 2023-06-27 DIAGNOSIS — I10 Essential (primary) hypertension: Secondary | ICD-10-CM | POA: Diagnosis not present

## 2023-06-27 LAB — CBC WITH DIFFERENTIAL/PLATELET
Abs Immature Granulocytes: 0.01 10*3/uL (ref 0.00–0.07)
Basophils Absolute: 0 10*3/uL (ref 0.0–0.1)
Basophils Relative: 1 %
Eosinophils Absolute: 0.1 10*3/uL (ref 0.0–0.5)
Eosinophils Relative: 2 %
HCT: 45.3 % (ref 39.0–52.0)
Hemoglobin: 15.2 g/dL (ref 13.0–17.0)
Immature Granulocytes: 0 %
Lymphocytes Relative: 45 %
Lymphs Abs: 2.6 10*3/uL (ref 0.7–4.0)
MCH: 30.3 pg (ref 26.0–34.0)
MCHC: 33.6 g/dL (ref 30.0–36.0)
MCV: 90.2 fL (ref 80.0–100.0)
Monocytes Absolute: 0.5 10*3/uL (ref 0.1–1.0)
Monocytes Relative: 9 %
Neutro Abs: 2.5 10*3/uL (ref 1.7–7.7)
Neutrophils Relative %: 43 %
Platelets: 273 10*3/uL (ref 150–400)
RBC: 5.02 MIL/uL (ref 4.22–5.81)
RDW: 12.7 % (ref 11.5–15.5)
WBC: 5.8 10*3/uL (ref 4.0–10.5)
nRBC: 0 % (ref 0.0–0.2)

## 2023-06-27 LAB — TROPONIN I (HIGH SENSITIVITY)
Troponin I (High Sensitivity): 3 ng/L (ref <18)
Troponin I (High Sensitivity): 4 ng/L (ref <18)

## 2023-06-27 LAB — BASIC METABOLIC PANEL
Anion gap: 9 (ref 5–15)
BUN: 15 mg/dL (ref 6–20)
CO2: 25 mmol/L (ref 22–32)
Calcium: 8.9 mg/dL (ref 8.9–10.3)
Chloride: 101 mmol/L (ref 98–111)
Creatinine, Ser: 1.02 mg/dL (ref 0.61–1.24)
GFR, Estimated: >60 mL/min (ref >60)
Glucose, Bld: 108 mg/dL — ABNORMAL HIGH (ref 70–99)
Potassium: 3.7 mmol/L (ref 3.5–5.1)
Sodium: 135 mmol/L (ref 135–145)

## 2023-06-27 MED ORDER — ALBUTEROL SULFATE (2.5 MG/3ML) 0.083% IN NEBU: 2.5000 mg | INHALATION_SOLUTION | RESPIRATORY_TRACT | Status: DC | PRN

## 2023-06-27 MED ORDER — METOPROLOL SUCCINATE ER 25 MG PO TB24
25.0000 mg | ORAL_TABLET | Freq: Every day | ORAL | Status: DC
  Administered 2023-06-27 – 2023-06-28 (×2): 25 mg via ORAL
  Filled 2023-06-27 (×2): qty 1

## 2023-06-27 MED ORDER — ACETAMINOPHEN 325 MG PO TABS: 650.0000 mg | ORAL_TABLET | Freq: Four times a day (QID) | ORAL | Status: DC | PRN

## 2023-06-27 MED ORDER — ONDANSETRON HCL 4 MG/2ML IJ SOLN: 4.0000 mg | Freq: Four times a day (QID) | INTRAMUSCULAR | Status: DC | PRN

## 2023-06-27 MED ORDER — ACETAMINOPHEN 650 MG RE SUPP: 650.0000 mg | Freq: Four times a day (QID) | RECTAL | Status: DC | PRN

## 2023-06-27 MED ORDER — BUDESONIDE 0.25 MG/2ML IN SUSP: 0.2500 mg | Freq: Two times a day (BID) | RESPIRATORY_TRACT | Status: DC | PRN

## 2023-06-27 MED ORDER — RIVAROXABAN 20 MG PO TABS
20.0000 mg | ORAL_TABLET | Freq: Every day | ORAL | Status: DC
  Administered 2023-06-27: 20 mg via ORAL
  Filled 2023-06-27: qty 1

## 2023-06-27 MED ORDER — BUDESONIDE 180 MCG/ACT IN AEPB: 2.0000 | INHALATION_SPRAY | Freq: Two times a day (BID) | RESPIRATORY_TRACT | Status: DC | PRN

## 2023-06-27 MED ORDER — HYDROCHLOROTHIAZIDE 12.5 MG PO TABS
25.0000 mg | ORAL_TABLET | Freq: Every day | ORAL | Status: DC
  Administered 2023-06-27 – 2023-06-28 (×2): 25 mg via ORAL
  Filled 2023-06-27 (×2): qty 2

## 2023-06-27 MED ORDER — ENALAPRIL MALEATE 10 MG PO TABS
20.0000 mg | ORAL_TABLET | Freq: Two times a day (BID) | ORAL | Status: DC
  Administered 2023-06-27 – 2023-06-28 (×2): 20 mg via ORAL
  Filled 2023-06-27 (×3): qty 2

## 2023-06-27 MED ORDER — SODIUM CHLORIDE 0.9 % IV SOLN: INTRAVENOUS | Status: DC

## 2023-06-27 MED ORDER — ONDANSETRON HCL 4 MG PO TABS: 4.0000 mg | ORAL_TABLET | Freq: Four times a day (QID) | ORAL | Status: DC | PRN

## 2023-06-27 MED ORDER — DILTIAZEM LOAD VIA INFUSION
10.0000 mg | Freq: Once | INTRAVENOUS | Status: AC
  Administered 2023-06-27: 10 mg via INTRAVENOUS
  Filled 2023-06-27: qty 10

## 2023-06-27 MED ORDER — FLUTICASONE PROPIONATE 50 MCG/ACT NA SUSP: 2.0000 | Freq: Every day | NASAL | Status: DC | PRN

## 2023-06-27 MED ORDER — DILTIAZEM HCL-DEXTROSE 125-5 MG/125ML-% IV SOLN (PREMIX)
5.0000 mg/h | INTRAVENOUS | Status: DC
  Administered 2023-06-27: 10 mg/h via INTRAVENOUS
  Administered 2023-06-27: 5 mg/h via INTRAVENOUS
  Filled 2023-06-27 (×3): qty 125
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
## 2023-06-27 NOTE — ED Provider Notes (Addendum)
[place] EMERGENCY DEPARTMENT AT [facility] Provider Note   CSN: [id number] Arrival date & time: 06/27/23  0944     History  Chief Complaint  Patient presents with   Chest Pain   Atrial Fibrillation    Jeffery Nielsen is a 57 y.o. male.  Patient with known history of atrial fibrillation is on Toprol XL as well as long-acting cardia exam.  Instructed to use short acting cardia exam as a rescue when his heart rate gets fast.  Started going fast this morning at about 0 800.  He took 30 mg of diltiazem orally.  Patient states that it did not slow down his heart rate.  He is post to be on Eliquis but he has been somewhat noncompliant definitely missed a dose yesterday and occasionally does miss a dose.  Patient followed by [facility] cardiology and is being scheduled for ablation later in August.  Patient states associated with some shortness of breath initially had some chest pain when it first started.  But has not had any since.       Home Medications Prior to Admission medications   Medication Sig Start Date End Date Taking? Authorizing Provider  acetaminophen (TYLENOL) 650 MG CR tablet Take 1,300 mg by mouth daily as needed for pain.    [provider]  albuterol (VENTOLIN HFA) 108 (90 Base) MCG/ACT inhaler Inhale 2 puffs into the lungs every 6 (six) hours as needed for wheezing or shortness of breath. 12/28/22   Lockie Mola, MD  budesonide (PULMICORT) 180 MCG/ACT inhaler Inhale 2 puffs into the lungs 2 (two) times daily as needed. 12/28/22   Lockie Mola, MD  diclofenac Sodium (VOLTAREN) 1 % GEL Apply 4 g topically 4 (four) times daily as needed. 03/14/21   Hilts, Casimiro Needle, MD  diltiazem (CARDIZEM CD) 240 MG 24 hr capsule TAKE 1 CAPSULE BY MOUTH EVERY DAY 02/14/23   Mealor, Roberts Gaudy, MD  diltiazem (CARDIZEM) 30 MG tablet Take 1 tablet every 4 hours AS NEEDED for AFIB heart rate >100 as long as top BP >100. 02/14/23   Mealor, Roberts Gaudy, MD  enalapril (VASOTEC) 20 MG tablet  TAKE 1 TABLET BY MOUTH TWICE A DAY 04/29/23   Chambliss, Estill Batten, MD  fluticasone (FLONASE) 50 MCG/ACT nasal spray Place 2 sprays into both nostrils daily as needed for allergies.    [provider]  hydrochlorothiazide (MICROZIDE) 12.5 MG capsule Take 2 capsules (25 mg total) by mouth daily. 01/07/23   [name], MD  metoprolol succinate (TOPROL-XL) 25 MG 24 hr tablet Take 1 tablet (25 mg total) by mouth daily. 02/14/23   Mealor, Roberts Gaudy, MD  OVER THE COUNTER MEDICATION Take 1 each by mouth daily. Beet Root Gummy supplement    [provider]  XARELTO 20 MG TABS tablet TAKE 1 TABLET BY MOUTH DAILY WITH SUPPER 11/08/22   Lewayne Bunting, MD      Allergies    Amoxil [amoxicillin] and Penicillins    Review of Systems   Review of Systems  Constitutional:  Negative for chills and fever.  HENT:  Negative for ear pain and sore throat.   Eyes:  Negative for pain and visual disturbance.  Respiratory:  Negative for cough.   Cardiovascular:  Positive for chest pain and palpitations.  Gastrointestinal:  Negative for abdominal pain and vomiting.  Genitourinary:  Negative for dysuria and hematuria.  Musculoskeletal:  Negative for arthralgias and back pain.  Skin:  Negative for color change and rash.  Neurological:  Negative for seizures and syncope.  All other systems reviewed and are negative.   Physical Exam Updated Vital Signs BP (!) 134/95 (BP Location: Left Arm)   Pulse (!) 102   Temp 98.6 F (37 C) (Oral)   Resp 13   Ht 1.803 m (5\' 11" )   Wt 77.6 kg   SpO2 97%   BMI 23.85 kg/m  Physical Exam Vitals and nursing note reviewed.  Constitutional:      General: He is not in acute distress.    Appearance: Normal appearance. He is well-developed. He is not ill-appearing.  HENT:     Head: Normocephalic and atraumatic.  Eyes:     Conjunctiva/sclera: Conjunctivae normal.  Cardiovascular:     Rate and Rhythm: Tachycardia present. Rhythm irregular.      Heart sounds: No murmur heard. Pulmonary:     Effort: Pulmonary effort is normal. No respiratory distress.     Breath sounds: Normal breath sounds.  Abdominal:     Palpations: Abdomen is soft.     Tenderness: There is no abdominal tenderness.  Musculoskeletal:        General: No swelling.     Cervical back: Neck supple.  Skin:    General: Skin is warm and dry.     Capillary Refill: Capillary refill takes less than 2 seconds.  Neurological:     General: No focal deficit present.     Mental Status: He is alert and oriented to person, place, and time.  Psychiatric:        Mood and Affect: Mood normal.     ED Results / Procedures / Treatments   Labs (all labs ordered are listed, but only abnormal results are displayed) Labs Reviewed  CBC WITH DIFFERENTIAL/PLATELET  BASIC METABOLIC PANEL    EKG EKG Interpretation Date/Time:  Thursday June 27 2023 09:57:06 EDT Ventricular Rate:  143 PR Interval:    QRS Duration:  78 QT Interval:  273 QTC Calculation: 421 R Axis:   66  Text Interpretation: Atrial fibrillation Paired ventricular premature complexes Borderline repolarization abnormality Confirmed by Vanetta Mulders [phone]) on 06/27/2023 10:12:46 AM  Radiology DG Chest Port 1 View  Result Date: 06/27/2023 CLINICAL DATA:  Chest pain, palpitations. EXAM: PORTABLE CHEST 1 VIEW COMPARISON:  September 04, 2022. FINDINGS: The heart size and mediastinal contours are within normal limits. Both lungs are clear. The visualized skeletal structures are unremarkable. IMPRESSION: No active disease. Electronically Signed   By: Lupita Raider M.D.   On: 06/27/2023 10:23    Procedures Procedures    Medications Ordered in ED Medications  0.9 %  sodium chloride infusion (has no administration in time range)  diltiazem (CARDIZEM) 1 mg/mL load via infusion 10 mg (has no administration in time range)    And  diltiazem (CARDIZEM) 125 mg in dextrose 5% 125 mL (1 mg/mL) infusion (has no  administration in time range)    ED Course/ Medical Decision Making/ A&P                                 Medical Decision Making Amount and/or Complexity of Data Reviewed Labs: ordered. Radiology: ordered.  Risk Prescription drug management. Decision regarding hospitalization.   CRITICAL CARE Performed by: Vanetta Mulders Total critical care time: 45 minutes Critical care time was exclusive of separately billable procedures and treating other patients. Critical care was necessary to treat or prevent imminent or life-threatening deterioration. Critical  care was time spent personally by me on the following activities: development of treatment plan with patient and/or surrogate as well as nursing, discussions with consultants, evaluation of patient's response to treatment, examination of patient, obtaining history from patient or surrogate, ordering and performing treatments and interventions, ordering and review of laboratory studies, ordering and review of radiographic studies, pulse oximetry and re-evaluation of patient's condition.  Sounds as if patient may have been noncompliant on his Eliquis.  Based on this we will just started on diltiazem drip will give a 10 mg bolus.  He already did take 30 mg p.o. at 830 this morning.  That should be working and is not controlling his heart rate.  Will also get chest x-ray.  Will add on troponin since there was chest pain at the beginning.  Suspect he probably will require admission unless he cardiovert.  Chest x-ray had no active disease.  EKG consistent with rapid atrial fibrillation.  Patient's heart rate on the diltiazem drip is now down to 100-1 20.  Improved.  CBC without any acute findings.  Basic metabolic panel without any acute findings.  Initial troponin was 3.  This was done because the patient had some chest pain at the onset of the palpitations.  And chest x-ray has no active disease.  Will discuss with cardiology and hospitalist  admission.    Final Clinical Impression(s) / ED Diagnoses Final diagnoses:  Atrial fibrillation with RVR [facility])    Rx / DC Orders ED Discharge Orders     None         Vanetta Mulders, MD 06/27/23 1045    Vanetta Mulders, MD 06/27/23 1252

## 2023-06-27 NOTE — ED Triage Notes (Signed)
Pt has A-fib, had some chest pain this morning, monitor hr with E-Cardia A-fib 160, took Cardizem remains in 149

## 2023-06-28 ENCOUNTER — Ambulatory Visit: Payer: 59 | Attending: Family Medicine

## 2023-06-28 ENCOUNTER — Observation Stay (HOSPITAL_BASED_OUTPATIENT_CLINIC_OR_DEPARTMENT_OTHER): Payer: 59

## 2023-06-28 DIAGNOSIS — I4891 Unspecified atrial fibrillation: Secondary | ICD-10-CM

## 2023-06-28 DIAGNOSIS — I10 Essential (primary) hypertension: Secondary | ICD-10-CM

## 2023-06-28 DIAGNOSIS — G4733 Obstructive sleep apnea (adult) (pediatric): Secondary | ICD-10-CM | POA: Diagnosis not present

## 2023-06-28 LAB — ECHOCARDIOGRAM COMPLETE
Area-P 1/2: 5.02 cm2
Height: 71 in
MV M vel: 1.28 m/s
MV Peak grad: 6.6 mmHg
S' Lateral: 3 cm
Weight: 4405.67 oz

## 2023-06-28 MED ORDER — POTASSIUM CHLORIDE CRYS ER 20 MEQ PO TBCR
40.0000 meq | EXTENDED_RELEASE_TABLET | Freq: Once | ORAL | Status: AC
  Administered 2023-06-28: 40 meq via ORAL
  Filled 2023-06-28: qty 2

## 2023-06-28 NOTE — Discharge Summary (Addendum)
Physician Discharge Summary   Patient: Jeffery Nielsen MRN: [id number] DOB: 1965-12-18  Admit date:     06/27/2023  Discharge date: 06/28/23  Discharge Physician: Jeffery Ranger, MD    PCP: Jeffery Living, MD   Recommendations at discharge:   Per cardiology, continue current management, no changes.  Patient has ablation scheduled on 07/19/2023  Discharge Diagnoses:    Atrial fibrillation with rapid ventricular response (HCC) Obstructive sleep apnea Hypertension  Hospital Course:  Patient is a 57 year old male with HTN, OSA, paroxysmal A-fib on Xarelto presented with recurrent symptomatic A-fib with RVR.  Patient reported that he was in his usual state of health until the morning of mission when he felt a little bit of chest pressure.  He reported the sensation of chest pressure lasted about 5 to 10 minutes and resolved spontaneously. In ED, heart rate was as high as 155.  Lab work including CBC, BMF, troponin unremarkable.  Chest x-ray unremarkable. He was started on IV diltiazem drip, cardiology was consulted.  Assessment and Plan:  Paroxysmal atrial fibrillation with RVR -Previously had ablation in 2021 and reoccurrence of A-fib in 2023 -Patient was placed on IV Cardizem drip, now back in normal sinus rhythm, rate controlled -Cardiology was consulted, recommended to continue diltiazem CD to 40 mg once daily with as needed 30 mg instant release for recurrent A-fib.  Avoid A-fib triggers. -Continue Xarelto -2D echo showed EF of 60 to 65%, no regional wall motion abnormalities -Patient has pending repeat ablation with Dr. Nelly Nielsen on 07/19/2023  OSA -Recommended nightly compliance with CPAP  Hypertension -BP stable, continue diltiazem, enalapril, HCTZ, metoprolol  Patient cleared by cardiology to discharge home.      Pain control - [employer] Controlled Substance Reporting System database was reviewed. and patient was instructed, not to drive, operate heavy machinery,  perform activities at heights, swimming or participation in water activities or provide baby-sitting services while on Pain, Sleep and Anxiety Medications; until their outpatient Physician has advised to do so again. Also recommended to not to take more than prescribed Pain, Sleep and Anxiety Medications.  Consultants: Cardiology Procedures performed: None Disposition: Home Diet recommendation:  Discharge Diet Orders (From admission, onward)     Start     Ordered   06/28/23 0000  Diet - low sodium heart healthy        06/28/23 1318            DISCHARGE MEDICATION: Allergies as of 06/28/2023       Reactions   Amoxil [amoxicillin] Anaphylaxis   Penicillins Anaphylaxis        Medication List     TAKE these medications    albuterol 108 (90 Base) MCG/ACT inhaler Commonly known as: VENTOLIN HFA Inhale 2 puffs into the lungs every 6 (six) hours as needed for wheezing or shortness of breath. What changed:  how much to take when to take this   BEET ROOT PO Take 2 tablets by mouth daily. When able to remember   budesonide 180 MCG/ACT inhaler Commonly known as: PULMICORT Inhale 2 puffs into the lungs 2 (two) times daily as needed.   diltiazem 240 MG 24 hr capsule Commonly known as: CARDIZEM CD TAKE 1 CAPSULE BY MOUTH EVERY DAY   diltiazem 30 MG tablet Commonly known as: Cardizem Take 1 tablet every 4 hours AS NEEDED for AFIB heart rate >100 as long as top BP >100. What changed:  how much to take how to take this when to take this reasons to  take this additional instructions   enalapril 20 MG tablet Commonly known as: VASOTEC TAKE 1 TABLET BY MOUTH TWICE A DAY   fluticasone 50 MCG/ACT nasal spray Commonly known as: FLONASE Place 1 spray into both nostrils daily as needed for allergies.   hydrochlorothiazide 12.5 MG capsule Commonly known as: MICROZIDE Take 2 capsules (25 mg total) by mouth daily. What changed:  how much to take when to take this    metoprolol succinate 25 MG 24 hr tablet Commonly known as: TOPROL-XL Take 1 tablet (25 mg total) by mouth daily.   Xarelto 20 MG Tabs tablet Generic drug: rivaroxaban TAKE 1 TABLET BY MOUTH DAILY WITH SUPPER        Follow-up Information     Jeffery Living, MD. Schedule an appointment as soon as possible for a visit in 2 week(s).   Specialty: Family Medicine Why: for hospital follow-up Contact information: [address] [place] [address] [phone]                Discharge Exam: Jeffery Nielsen Weights   06/27/23 1003 06/28/23 0510  Weight: 77.6 kg 124.9 kg   S: Heart rate back in normal sinus rhythm, rate controlled.  No chest pain or acute shortness of breath, feels better.  Wife at the bedside.   BP 138/74   Pulse 64   Temp 97.8 F (36.6 C) (Oral)   Resp 14   Ht 5\' 11"  (1.803 m)   Wt 124.9 kg   SpO2 92%   BMI 38.40 kg/m   Physical Exam General: Alert and oriented x 3, NAD Cardiovascular: S1 S2 clear, RRR.  Respiratory: CTAB, no wheezing, rales  Gastrointestinal: Soft, nontender, nondistended, NBS Ext: no pedal edema bilaterally Neuro: no new deficits Psych: Normal affect    Condition at discharge: fair  The results of significant diagnostics from this hospitalization (including imaging, microbiology, ancillary and laboratory) are listed below for reference.   Imaging Studies: ECHOCARDIOGRAM COMPLETE  Result Date: 06/28/2023    ECHOCARDIOGRAM REPORT   Patient Name:   Jeffery Nielsen Date of Exam: 06/28/2023 Medical Rec #:  [id number]  Height:       71.0 in Accession #:    [id number] Weight:       275.4 lb Date of Birth:  Apr 17, 1966 BSA:          2.415 m Patient Age:    56 years   BP:           133/95 mmHg Patient Gender: M          HR:           71 bpm. Exam Location:  Inpatient Procedure: 2D Echo, Cardiac Doppler and Color Doppler Indications:    Atrial Fibrillation I48.91  History:        Patient has prior history of Echocardiogram  examinations, most                 recent 09/04/2022. Arrythmias:Atrial Fibrillation; Risk                 Factors:Sleep Apnea, Hypertension and Non-Smoker.  Sonographer:    Jeffery Nielsen Referring Phys: [id number] Jeffery Nielsen Jeffery Nielsen  Sonographer Comments: Patient is obese. IMPRESSIONS  1. Technically difficult study with poor visualization of cardiac structures.  2. Left ventricular ejection fraction, by estimation, is 60 to 65%. The left ventricle has normal function. The left ventricle has no regional wall motion abnormalities. Left ventricular diastolic function could not be evaluated.  3. Right  ventricular systolic function is normal. The right ventricular size is normal.  4. The mitral valve is normal in structure. No evidence of mitral valve regurgitation. No evidence of mitral stenosis.  5. The aortic valve is normal in structure. Aortic valve regurgitation is not visualized. No aortic stenosis is present.  6. The inferior vena cava is normal in size with greater than 50% respiratory variability, suggesting right atrial pressure of 3 mmHg. FINDINGS  Left Ventricle: Left ventricular ejection fraction, by estimation, is 60 to 65%. The left ventricle has normal function. The left ventricle has no regional wall motion abnormalities. The left ventricular internal cavity size was normal in size. There is  no left ventricular hypertrophy. Left ventricular diastolic function could not be evaluated due to nondiagnostic images. Left ventricular diastolic function could not be evaluated. Right Ventricle: The right ventricular size is normal. No increase in right ventricular wall thickness. Right ventricular systolic function is normal. Left Atrium: Left atrial size was normal in size. Right Atrium: Right atrial size was normal in size. Pericardium: There is no evidence of pericardial effusion. Mitral Valve: The mitral valve is normal in structure. No evidence of mitral valve regurgitation. No evidence of mitral valve stenosis.  Tricuspid Valve: The tricuspid valve is normal in structure. Tricuspid valve regurgitation is not demonstrated. No evidence of tricuspid stenosis. Aortic Valve: The aortic valve is normal in structure. Aortic valve regurgitation is not visualized. No aortic stenosis is present. Pulmonic Valve: The pulmonic valve was not well visualized. Pulmonic valve regurgitation is not visualized. No evidence of pulmonic stenosis. Aorta: The aortic root is normal in size and structure. Venous: The inferior vena cava is normal in size with greater than 50% respiratory variability, suggesting right atrial pressure of 3 mmHg. IAS/Shunts: No atrial level shunt detected by color flow Doppler.  LEFT VENTRICLE PLAX 2D LVIDd:         4.10 cm   Diastology LVIDs:         3.00 cm   LV e' medial:    8.92 cm/s LV PW:         0.90 cm   LV E/e' medial:  9.3 LV IVS:        0.90 cm   LV e' lateral:   6.74 cm/s LVOT diam:     1.80 cm   LV E/e' lateral: 12.3 LV SV:         37 LV SV Index:   15 LVOT Area:     2.54 cm  RIGHT VENTRICLE RV S prime:     12.80 cm/s TAPSE (M-mode): 1.9 cm LEFT ATRIUM             Index        RIGHT ATRIUM           Index LA diam:        3.60 cm 1.49 cm/m   RA Area:     14.70 cm LA Vol (A2C):   51.3 ml 21.24 ml/m  RA Volume:   35.00 ml  14.49 ml/m LA Vol (A4C):   44.9 ml 18.59 ml/m LA Biplane Vol: 48.7 ml 20.16 ml/m  AORTIC VALVE LVOT Vmax:   84.20 cm/s LVOT Vmean:  49.800 cm/s LVOT VTI:    0.144 m  AORTA Ao Root diam: 3.10 cm Ao Asc diam:  3.20 cm MITRAL VALVE MV Area (PHT): 5.02 cm    SHUNTS MV Decel Time: 151 msec    Systemic VTI:  0.14 m MR Peak grad: 6.6  mmHg     Systemic Diam: 1.80 cm MR Vmax:      128.25 cm/s MV E velocity: 82.70 cm/s MV A velocity: 55.30 cm/s MV E/A ratio:  1.50 Aditya Sabharwal Electronically signed by Dorthula Nettles Signature Date/Time: 06/28/2023/1:03:35 PM    Final    DG Chest Port 1 View  Result Date: 06/27/2023 CLINICAL DATA:  Chest pain, palpitations. EXAM: PORTABLE CHEST 1 VIEW  COMPARISON:  September 04, 2022. FINDINGS: The heart size and mediastinal contours are within normal limits. Both lungs are clear. The visualized skeletal structures are unremarkable. IMPRESSION: No active disease. Electronically Signed   By: Lupita Raider M.D.   On: 06/27/2023 10:23    Microbiology: Results for orders placed or performed during the hospital encounter of 05/07/20  SARS CORONAVIRUS 2 (TAT 6-24 HRS) Nasopharyngeal Nasopharyngeal Swab     Status: None   Collection Time: 05/07/20  9:19 AM   Specimen: Nasopharyngeal Swab  Result Value Ref Range Status   SARS Coronavirus 2 NEGATIVE NEGATIVE Final    Comment: (NOTE) SARS-CoV-2 target nucleic acids are NOT DETECTED.  The SARS-CoV-2 RNA is generally detectable in upper and lower respiratory specimens during the acute phase of infection. Negative results do not preclude SARS-CoV-2 infection, do not rule out co-infections with other pathogens, and should not be used as the sole basis for treatment or other patient management decisions. Negative results must be combined with clinical observations, patient history, and epidemiological information. The expected result is Negative.  Fact Sheet for Patients: HairSlick.no  Fact Sheet for Healthcare Providers: quierodirigir.com  This test is not yet approved or cleared by the [place] FDA and  has been authorized for detection and/or diagnosis of SARS-CoV-2 by FDA under an Emergency Use Authorization (EUA). This EUA will remain  in effect (meaning this test can be used) for the duration of the COVID-19 declaration under Se ction 564(b)(1) of the Act, 21 U.S.C. section 360bbb-3(b)(1), unless the authorization is terminated or revoked sooner.  Performed at [facility], [address], [place], [place] [address]     Labs: CBC: Recent Labs  Lab 06/27/23 1025 06/28/23 0406  WBC 5.8 6.7  NEUTROABS 2.5  --   HGB  15.2 15.2  HCT 45.3 44.8  MCV 90.2 90.5  PLT 273 256   Basic Metabolic Panel: Recent Labs  Lab 06/27/23 1025 06/28/23 0406  NA 135 134*  Jeffery 3.7 3.3*  CL 101 103  CO2 25 25  GLUCOSE 108* 116*  BUN 15 13  CREATININE 1.02 0.86  CALCIUM 8.9 8.4*   Liver Function Tests: No results for input(s): "AST", "ALT", "ALKPHOS", "BILITOT", "PROT", "ALBUMIN" in the last 168 hours. CBG: No results for input(s): "GLUCAP" in the last 168 hours.  Discharge time spent: less than 30 minutes.  Signed: Thad Ranger, MD [facility] 06/28/2023
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
## 2023-06-28 NOTE — Progress Notes (Signed)
Transition of Care [facility]) - Inpatient Brief Assessment   Patient Details  Name: Jeffery Nielsen MRN: [id number] Date of Birth: 09/21/1966  Transition of Care [facility]) CM/SW Contact:    Larrie Kass, LCSW Phone Number: 06/28/2023, 12:47 PM   Clinical Narrative:  Transition of Care Department [facility]) has reviewed patient and no TOC needs have been identified at this time. We will continue to monitor patient advancement through interdisciplinary progression rounds. If new patient transition needs arise, please place a TOC consult.    Transition of Care Asessment: Insurance and Status: Insurance coverage has been reviewed Patient has primary care physician: Yes Home environment has been reviewed: yes Prior level of function:: independent Prior/Current Home Services: No current home services Social Determinants of Health Reivew: SDOH reviewed no interventions necessary Readmission risk has been reviewed: Yes Transition of care needs: no transition of care needs at this time

## 2023-06-28 NOTE — Progress Notes (Signed)
  Echocardiogram 2D Echocardiogram has been performed.  Maren Reamer 06/28/2023, 12:10 PM

## 2023-06-28 NOTE — Plan of Care (Signed)
  Problem: Health Behavior/Discharge Planning: Goal: Ability to manage health-related needs will improve Outcome: Progressing   Problem: Clinical Measurements: Goal: Will remain free from infection Outcome: Progressing Goal: Diagnostic test results will improve Outcome: Progressing   Problem: Activity: Goal: Risk for activity intolerance will decrease Outcome: Progressing   Problem: Nutrition: Goal: Adequate nutrition will be maintained Outcome: Progressing   Problem: Coping: Goal: Level of anxiety will decrease Outcome: Progressing

## 2023-06-28 NOTE — Progress Notes (Signed)
MEWS Progress Note  Patient Details Name: Eusevio Schriver MRN: [id number] DOB: 04/28/1966 Today's Date: 06/28/2023   MEWS Flowsheet Documentation:  Assess: MEWS Score Temp: 98.2 F (36.8 C) BP: 120/63 MAP (mmHg): 82 Pulse Rate: 88 ECG Heart Rate: 95 Resp: 18 Level of Consciousness: Alert SpO2: 97 % O2 Device: Room Air Assess: MEWS Score MEWS Temp: 0 MEWS Systolic: 0 MEWS Pulse: 0 MEWS RR: 0 MEWS LOC: 0 MEWS Score: 0 MEWS Score Color: Green Assess: SIRS CRITERIA SIRS Temperature : 0 SIRS Respirations : 0 SIRS Pulse: 1 SIRS WBC: 0 SIRS Score Sum : 1 SIRS Temperature : 0 SIRS Pulse: 1 SIRS Respirations : 0 SIRS WBC: 0 SIRS Score Sum : 1 Assess: if the MEWS score is Yellow or Red Were vital signs accurate and taken at a resting state?: No, vital signs rechecked Does the patient meet 2 or more of the SIRS criteria?: No MEWS guidelines implemented : No, previously yellow, continue vital signs every 4 hours Notify: Charge Nurse/RN Name of Charge Nurse/RN Notified: Lauren Latanya Presser 06/28/2023, 12:33 AM

## 2023-07-01 ENCOUNTER — Telehealth: Payer: Self-pay

## 2023-07-01 NOTE — Transitions of Care (Post Inpatient/ED Visit) (Signed)
   07/01/2023  Name: Quirino Renna MRN: [id number] DOB: [birth year]  Today's TOC FU Call Status: Today's TOC FU Call Status:: Unsuccessful Call (1st Attempt) Unsuccessful Call (1st Attempt) Date: 07/01/23  Attempted to reach the patient regarding the most recent Inpatient/ED visit.  Follow Up Plan: Additional outreach attempts will be made to reach the patient to complete the Transitions of Care (Post Inpatient/ED visit) call.   Signature Karena Addison, LPN [facility] Nurse Health Advisor Direct Dial [phone]

## 2023-07-02 NOTE — Transitions of Care (Post Inpatient/ED Visit) (Signed)
   07/02/2023  Name: Jeffery Nielsen MRN: [id number] DOB: Apr 10, 1966  Today's TOC FU Call Status: Today's TOC FU Call Status:: Unsuccessful Call (2nd Attempt) Unsuccessful Call (1st Attempt) Date: 07/01/23 Unsuccessful Call (2nd Attempt) Date: 07/02/23  Attempted to reach the patient regarding the most recent Inpatient/ED visit.  Follow Up Plan: Additional outreach attempts will be made to reach the patient to complete the Transitions of Care (Post Inpatient/ED visit) call.   Signature Karena Addison, LPN [facility] Nurse Health Advisor Direct Dial [phone]

## 2023-07-03 NOTE — Transitions of Care (Post Inpatient/ED Visit) (Signed)
   07/03/2023  Name: Jeffery Nielsen MRN: [id number] DOB: 1966/07/21  Today's TOC FU Call Status: Today's TOC FU Call Status:: Unsuccessful Call (3rd Attempt) Unsuccessful Call (1st Attempt) Date: 07/01/23 Unsuccessful Call (2nd Attempt) Date: 07/02/23 Unsuccessful Call (3rd Attempt) Date: 07/03/23  Attempted to reach the patient regarding the most recent Inpatient/ED visit.  Follow Up Plan: No further outreach attempts will be made at this time. We have been unable to contact the patient.  Signature Karena Addison, LPN [facility] Nurse Health Advisor Direct Dial [phone]

## 2023-07-11 ENCOUNTER — Telehealth (HOSPITAL_COMMUNITY): Payer: Self-pay | Admitting: *Deleted

## 2023-07-11 ENCOUNTER — Telehealth: Payer: Self-pay

## 2023-07-11 NOTE — Telephone Encounter (Signed)
Pt aware of time change of his procedure on 8/23 - He will arrive at 5:30 AM for a 7:30 AM procedure time.

## 2023-07-11 NOTE — Telephone Encounter (Signed)
Reaching out to patient to offer assistance regarding upcoming cardiac imaging study; pt verbalizes understanding of appt date/time, parking situation and where to check in, pre-test NPO status and medications ordered, and verified current allergies; name and call back number provided for further questions should they arise Hayley Sharpe RN Navigator Cardiac Imaging [facility] [phone] office [phone] cell  

## 2023-07-12 ENCOUNTER — Ambulatory Visit (HOSPITAL_COMMUNITY)
Admission: RE | Admit: 2023-07-12 | Discharge: 2023-07-12 | Disposition: A | Discharge status: Home / Self Care | Payer: 59 | Source: Ambulatory Visit | Attending: Cardiovascular Disease | Admitting: Cardiovascular Disease

## 2023-07-12 ENCOUNTER — Other Ambulatory Visit: Payer: Self-pay | Admitting: Family Medicine

## 2023-07-12 DIAGNOSIS — I480 Paroxysmal atrial fibrillation: Secondary | ICD-10-CM | POA: Diagnosis not present

## 2023-07-12 DIAGNOSIS — Z01818 Encounter for other preprocedural examination: Secondary | ICD-10-CM | POA: Diagnosis not present

## 2023-07-12 LAB — AMB RESULTS CONSOLE CBG: Glucose: 103

## 2023-07-12 MED ORDER — IOHEXOL 350 MG/ML SOLN
100.0000 mL | Freq: Once | INTRAVENOUS | Status: AC | PRN
  Administered 2023-07-12: 100 mL via INTRAVENOUS

## 2023-07-18 ENCOUNTER — Telehealth: Payer: Self-pay

## 2023-07-18 ENCOUNTER — Encounter: Payer: Self-pay | Admitting: *Deleted

## 2023-07-18 NOTE — Telephone Encounter (Signed)
LM for pt to call back to reschedule AF Ablation that is scheduled on 8/23 due to a family emergency with Dr. [name].

## 2023-07-18 NOTE — Progress Notes (Signed)
Pt attended 07/12/23 screening event where his b/p was 117/92 and his blood sugar was 103. At the event, the pt did not identify any SDOH  insecurities and his PCP's name was noted as Dr. Deirdre Priest. Chart review indicates pt last saw PCP on 07/17/22; and [facility] indicates Dr. Deirdre Priest addressed med refill as recently as 07/12/23. Call to pt to verify he is still seeing Dr. Deirdre Priest as his PCP and if he has f/u about his b/p with his PCP or his cardiologist. Jolyn Lent sent to Dr. Deirdre Priest with 8/16/224 event results. Pt returned call and confirmed he is continuing to follow up his healthcare with Dr. Deirdre Priest as his PCP, and that he has access to the specialty and primary care and medications that he needs. Pt did not verbalize any healthcare access needs or SDOH insecurities at present. No additional health equity team support indicated at this time.

## 2023-07-19 ENCOUNTER — Ambulatory Visit (HOSPITAL_COMMUNITY)
Admission: RE | Admit: 2023-07-19 | Discharge: 2023-07-19 | Disposition: A | Discharge status: Home / Self Care | Payer: 59 | Attending: Cardiovascular Disease | Admitting: Cardiovascular Disease

## 2023-07-19 DIAGNOSIS — I4891 Unspecified atrial fibrillation: Secondary | ICD-10-CM

## 2023-07-19 NOTE — Pre-Procedure Instructions (Signed)
Patient showed for his procedure this morning.  Informed him that procedure had been canceled due to Dr [name] had family emergency.  Office tried to call patient yesterday and left message for patient to call office back, he states he didn't get message.  Told him office with call him to reschedule.

## 2023-07-26 ENCOUNTER — Ambulatory Visit: Payer: 59 | Attending: Family Medicine

## 2023-07-26 DIAGNOSIS — I480 Paroxysmal atrial fibrillation: Secondary | ICD-10-CM

## 2023-07-26 DIAGNOSIS — Z01818 Encounter for other preprocedural examination: Secondary | ICD-10-CM

## 2023-07-27 LAB — CBC
Hematocrit: 42 % (ref 37.5–51.0)
Hemoglobin: 14.3 g/dL (ref 13.0–17.7)
MCH: 30.7 pg (ref 26.6–33.0)
MCHC: 34 g/dL (ref 31.5–35.7)
MCV: 90 fL (ref 79–97)
Platelets: 279 10*3/uL (ref 150–450)
RBC: 4.66 x10E6/uL (ref 4.14–5.80)
RDW: 12.7 % (ref 11.6–15.4)
WBC: 7.6 10*3/uL (ref 3.4–10.8)

## 2023-07-27 LAB — BASIC METABOLIC PANEL
BUN/Creatinine Ratio: 13 (ref 9–20)
BUN: 14 mg/dL (ref 6–24)
CO2: 27 mmol/L (ref 20–29)
Calcium: 9.2 mg/dL (ref 8.7–10.2)
Chloride: 104 mmol/L (ref 96–106)
Creatinine, Ser: 1.08 mg/dL (ref 0.76–1.27)
Glucose: 85 mg/dL (ref 70–99)
Potassium: 5.2 mmol/L (ref 3.5–5.2)
Sodium: 144 mmol/L (ref 134–144)
eGFR: 80 mL/min/{1.73_m2} (ref >59)

## 2023-07-31 NOTE — Pre-Procedure Instructions (Signed)
Instructed patient on the following items: Arrival time 0830 Nothing to eat or drink after midnight No meds AM of procedure Responsible person to drive you home and stay with you for 24 hrs  Have you missed any doses of anti-coagulant Xarelto- takes once a day, hasn't missed any doses.  Don't take any meds in the morning.

## 2023-08-01 ENCOUNTER — Ambulatory Visit (HOSPITAL_BASED_OUTPATIENT_CLINIC_OR_DEPARTMENT_OTHER): Payer: 59 | Admitting: Anesthesiology

## 2023-08-01 ENCOUNTER — Ambulatory Visit (HOSPITAL_COMMUNITY)
Admission: RE | Admit: 2023-08-01 | Discharge: 2023-08-01 | Disposition: A | Discharge status: Home / Self Care | Payer: 59 | Attending: Cardiovascular Disease | Admitting: Cardiovascular Disease

## 2023-08-01 ENCOUNTER — Encounter (HOSPITAL_COMMUNITY)
Admission: RE | Disposition: A | Discharge status: Home / Self Care | Payer: Self-pay | Source: Home / Self Care | Attending: Cardiovascular Disease

## 2023-08-01 ENCOUNTER — Ambulatory Visit (HOSPITAL_COMMUNITY): Payer: 59 | Admitting: Anesthesiology

## 2023-08-01 ENCOUNTER — Other Ambulatory Visit: Payer: Self-pay

## 2023-08-01 DIAGNOSIS — G4733 Obstructive sleep apnea (adult) (pediatric): Secondary | ICD-10-CM | POA: Insufficient documentation

## 2023-08-01 DIAGNOSIS — Z79899 Other long term (current) drug therapy: Secondary | ICD-10-CM | POA: Diagnosis not present

## 2023-08-01 DIAGNOSIS — E669 Obesity, unspecified: Secondary | ICD-10-CM | POA: Insufficient documentation

## 2023-08-01 DIAGNOSIS — I4819 Other persistent atrial fibrillation: Secondary | ICD-10-CM | POA: Insufficient documentation

## 2023-08-01 DIAGNOSIS — I4891 Unspecified atrial fibrillation: Secondary | ICD-10-CM | POA: Diagnosis not present

## 2023-08-01 DIAGNOSIS — I10 Essential (primary) hypertension: Secondary | ICD-10-CM | POA: Insufficient documentation

## 2023-08-01 DIAGNOSIS — Z6837 Body mass index (BMI) 37.0-37.9, adult: Secondary | ICD-10-CM | POA: Diagnosis not present

## 2023-08-01 HISTORY — PX: ATRIAL FIBRILLATION ABLATION: EP1191

## 2023-08-01 LAB — POCT ACTIVATED CLOTTING TIME
Activated Clotting Time: 318 s
Activated Clotting Time: 422 s

## 2023-08-01 SURGERY — ATRIAL FIBRILLATION ABLATION
Anesthesia: General

## 2023-08-01 MED ORDER — SODIUM CHLORIDE 0.9% FLUSH: 3.0000 mL | Freq: Two times a day (BID) | INTRAVENOUS | Status: DC

## 2023-08-01 MED ORDER — SODIUM CHLORIDE 0.9 % IV SOLN: 250.0000 mL | INTRAVENOUS | Status: DC | PRN

## 2023-08-01 MED ORDER — ATROPINE SULFATE 1 MG/10ML IJ SOSY
PREFILLED_SYRINGE | INTRAMUSCULAR | Status: AC
  Filled 2023-08-01: qty 10

## 2023-08-01 MED ORDER — ACETAMINOPHEN 325 MG PO TABS: 650.0000 mg | ORAL_TABLET | ORAL | Status: DC | PRN

## 2023-08-01 MED ORDER — ONDANSETRON HCL 4 MG/2ML IJ SOLN: 4.0000 mg | Freq: Four times a day (QID) | INTRAMUSCULAR | Status: DC | PRN

## 2023-08-01 MED ORDER — HEPARIN SODIUM (PORCINE) 1000 UNIT/ML IJ SOLN
INTRAMUSCULAR | Status: DC | PRN
  Administered 2023-08-01: 1000 [IU] via INTRAVENOUS
  Administered 2023-08-01: 20000 [IU] via INTRAVENOUS

## 2023-08-01 MED ORDER — DEXAMETHASONE SODIUM PHOSPHATE 10 MG/ML IJ SOLN
INTRAMUSCULAR | Status: DC | PRN
  Administered 2023-08-01: 5 mg via INTRAVENOUS

## 2023-08-01 MED ORDER — PROPOFOL 10 MG/ML IV BOLUS
INTRAVENOUS | Status: DC | PRN
  Administered 2023-08-01: 200 mg via INTRAVENOUS

## 2023-08-01 MED ORDER — ATROPINE SULFATE 1 MG/ML IV SOLN
INTRAVENOUS | Status: DC | PRN
Start: 2023-08-01 — End: 2023-08-01
  Administered 2023-08-01: 1 mg via INTRAVENOUS

## 2023-08-01 MED ORDER — OXYCODONE HCL 5 MG/5ML PO SOLN: 5.0000 mg | Freq: Once | ORAL | Status: DC | PRN

## 2023-08-01 MED ORDER — OXYCODONE HCL 5 MG PO TABS: 5.0000 mg | ORAL_TABLET | Freq: Once | ORAL | Status: DC | PRN

## 2023-08-01 MED ORDER — HEPARIN SODIUM (PORCINE) 1000 UNIT/ML IJ SOLN
INTRAMUSCULAR | Status: AC
  Filled 2023-08-01: qty 10

## 2023-08-01 MED ORDER — ROCURONIUM BROMIDE 10 MG/ML (PF) SYRINGE
PREFILLED_SYRINGE | INTRAVENOUS | Status: DC | PRN
  Administered 2023-08-01: 50 mg via INTRAVENOUS
  Administered 2023-08-01: 30 mg via INTRAVENOUS

## 2023-08-01 MED ORDER — FENTANYL CITRATE (PF) 250 MCG/5ML IJ SOLN
INTRAMUSCULAR | Status: DC | PRN
  Administered 2023-08-01 (×2): 50 ug via INTRAVENOUS

## 2023-08-01 MED ORDER — MIDAZOLAM HCL 2 MG/2ML IJ SOLN
INTRAMUSCULAR | Status: DC | PRN
  Administered 2023-08-01: 2 mg via INTRAVENOUS

## 2023-08-01 MED ORDER — ONDANSETRON HCL 4 MG/2ML IJ SOLN
INTRAMUSCULAR | Status: DC | PRN
  Administered 2023-08-01: 4 mg via INTRAVENOUS

## 2023-08-01 MED ORDER — FENTANYL CITRATE (PF) 100 MCG/2ML IJ SOLN
INTRAMUSCULAR | Status: AC
  Filled 2023-08-01: qty 2

## 2023-08-01 MED ORDER — SODIUM CHLORIDE 0.9 % IV SOLN: INTRAVENOUS | Status: DC

## 2023-08-01 MED ORDER — SODIUM CHLORIDE 0.9 % IV SOLN
INTRAVENOUS | Status: DC | PRN
Start: 2023-08-01 — End: 2023-08-01

## 2023-08-01 MED ORDER — FENTANYL CITRATE (PF) 100 MCG/2ML IJ SOLN: 25.0000 ug | INTRAMUSCULAR | Status: DC | PRN

## 2023-08-01 MED ORDER — SODIUM CHLORIDE 0.9% FLUSH: 3.0000 mL | INTRAVENOUS | Status: DC | PRN

## 2023-08-01 MED ORDER — SUGAMMADEX SODIUM 200 MG/2ML IV SOLN
INTRAVENOUS | Status: DC | PRN
  Administered 2023-08-01: 400 mg via INTRAVENOUS

## 2023-08-01 MED ORDER — LIDOCAINE 2% (20 MG/ML) 5 ML SYRINGE
INTRAMUSCULAR | Status: DC | PRN
  Administered 2023-08-01: 60 mg via INTRAVENOUS

## 2023-08-01 MED ORDER — HEPARIN (PORCINE) IN NACL 1000-0.9 UT/500ML-% IV SOLN
INTRAVENOUS | Status: DC | PRN
  Administered 2023-08-01 (×4): 500 mL

## 2023-08-01 MED ORDER — PROTAMINE SULFATE 10 MG/ML IV SOLN
INTRAVENOUS | Status: DC | PRN
Start: 2023-08-01 — End: 2023-08-01
  Administered 2023-08-01: 35 mg via INTRAVENOUS

## 2023-08-01 MED ORDER — MIDAZOLAM HCL 5 MG/5ML IJ SOLN
INTRAMUSCULAR | Status: AC
  Filled 2023-08-01: qty 5

## 2023-08-01 SURGICAL SUPPLY — 25 items
BAG SNAP BAND KOVER 36X36 (MISCELLANEOUS) IMPLANT
BLANKET WARM UNDERBOD FULL ACC (MISCELLANEOUS) ×1 IMPLANT
CABLE PFA RX CATH CONN (CABLE) IMPLANT
CATH 8FR REPROCESSED SOUNDSTAR (CATHETERS) ×1 IMPLANT
CATH 8FR SOUNDSTAR REPROCESSED (CATHETERS) IMPLANT
CATH FARAWAVE ABLATION 31 (CATHETERS) IMPLANT
CATH OCTARAY 2.0 F 3-3-3-3-3 (CATHETERS) IMPLANT
CATH SOUNDSTAR ECO 8FR (CATHETERS) IMPLANT
CATH WEBSTER BI DIR CS D-F CRV (CATHETERS) IMPLANT
CLOSURE PERCLOSE PROSTYLE (VASCULAR PRODUCTS) IMPLANT
COVER SWIFTLINK CONNECTOR (BAG) ×1 IMPLANT
DEVICE CLOSURE MYNXGRIP 6/7F (Vascular Products) IMPLANT
DILATOR VESSEL 38 20CM 16FR (INTRODUCER) IMPLANT
GUIDEWIRE INQWIRE 1.5J.035X260 (WIRE) IMPLANT
INQWIRE 1.5J .035X260CM (WIRE) ×1
MAT PREVALON FULL STRYKER (MISCELLANEOUS) IMPLANT
PACK EP LATEX FREE (CUSTOM PROCEDURE TRAY) ×1
PACK EP LF (CUSTOM PROCEDURE TRAY) ×1 IMPLANT
PAD DEFIB RADIO PHYSIO CONN (PAD) ×1 IMPLANT
PATCH CARTO3 (PAD) IMPLANT
SHEATH FARADRIVE STEERABLE (SHEATH) IMPLANT
SHEATH PINNACLE 8F 10CM (SHEATH) IMPLANT
SHEATH PINNACLE 9F 10CM (SHEATH) IMPLANT
SHEATH PROBE COVER 6X72 (BAG) IMPLANT
SHEATH WIRE KIT BAYLIS SL1 (KITS) IMPLANT

## 2023-08-01 NOTE — Anesthesia Procedure Notes (Signed)
Procedure Name: Intubation Date/Time: 08/01/2023 11:06 AM  Performed by: Camillia Herter, CRNAPre-anesthesia Checklist: Patient identified, Emergency Drugs available, Suction available and Patient being monitored Patient Re-evaluated:Patient Re-evaluated prior to induction Oxygen Delivery Method: Circle System Utilized Preoxygenation: Pre-oxygenation with 100% oxygen Induction Type: IV induction Ventilation: Two handed mask ventilation required Laryngoscope Size: Miller and 3 Grade View: Grade II Tube type: Oral Tube size: 8.0 mm Number of attempts: 1 Airway Equipment and Method: Stylet and Oral airway Placement Confirmation: ETT inserted through vocal cords under direct vision, positive ETCO2 and breath sounds checked- equal and bilateral Tube secured with: Tape Dental Injury: Teeth and Oropharynx as per pre-operative assessment

## 2023-08-01 NOTE — Discharge Instructions (Addendum)
 Cardiac Ablation, Care After  This sheet gives you information about how to care for yourself after your procedure. Your health care provider may also give you more specific instructions. If you have problems or questions, contact your health care provider. What can I expect after the procedure? After the procedure, it is common to have: Bruising around your puncture site. Tenderness around your puncture site. Skipped heartbeats. If you had an atrial fibrillation ablation, you may have atrial fibrillation during the first several months after your procedure.  Tiredness (fatigue).  Follow these instructions at home: Puncture site care  Follow instructions from your health care provider about how to take care of your puncture site. Make sure you:   If a large square bandage is present, this may be removed 24 hours after surgery.  Check your puncture site every day for signs of infection. Check for: Redness, swelling, or pain. Fluid or blood. If your puncture site starts to bleed, lie down on your back, apply firm pressure to the area, and contact your health care provider. Warmth. Pus or a bad smell. A pea or small marble sized lump at the site is normal and can take up to three months to resolve.  Driving Do not drive for at least 4 days after your procedure or however long your health care provider recommends. (Do not resume driving if you have previously been instructed not to drive for other health reasons.) Do not drive or use heavy machinery while taking prescription pain medicine. Activity Avoid activities that take a lot of effort for at least 7 days after your procedure. Do not lift anything that is heavier than 5 lb (4.5 kg) for one week.  No sexual activity for 1 week.  Return to your normal activities as told by your health care provider. Ask your health care provider what activities are safe for you. General instructions Take over-the-counter and prescription medicines only  as told by your health care provider. Do not use any products that contain nicotine or tobacco, such as cigarettes and e-cigarettes. If you need help quitting, ask your health care provider. You may shower after 24 hours, but Do not take baths, swim, or use a hot tub for 1 week.  Do not drink alcohol for 24 hours after your procedure. Keep all follow-up visits as told by your health care provider. This is important. Contact a health care provider if: You have redness, mild swelling, or pain around your puncture site. You have fluid or blood coming from your puncture site that stops after applying firm pressure to the area. Your puncture site feels warm to the touch. You have pus or a bad smell coming from your puncture site. You have a fever. You have chest pain or discomfort that spreads to your neck, jaw, or arm. You have chest pain that is worse with lying on your back or taking a deep breath. You are sweating a lot. You feel nauseous. You have a fast or irregular heartbeat. You have shortness of breath. You are dizzy or light-headed and feel the need to lie down. You have pain or numbness in the arm or leg closest to your puncture site. Get help right away if: Your puncture site suddenly swells. Your puncture site is bleeding and the bleeding does not stop after applying firm pressure to the area. These symptoms may represent a serious problem that is an emergency. Do not wait to see if the symptoms will go away. Get medical help right away.  Call your local emergency services (911 in the [place] Do not drive yourself to the hospital. Summary After the procedure, it is normal to have bruising and tenderness at the puncture site in your groin, neck, or forearm. Check your puncture site every day for signs of infection. Get help right away if your puncture site is bleeding and the bleeding does not stop after applying firm pressure to the area. This is a medical emergency. This information  is not intended to replace advice given to you by your health care provider. Make sure you discuss any questions you have with your health care provider.

## 2023-08-01 NOTE — Anesthesia Preprocedure Evaluation (Signed)
Anesthesia Evaluation  Patient identified by MRN, date of birth, ID band Patient awake    Reviewed: Allergy & Precautions, H&P , NPO status , Patient's Chart, lab work & pertinent test results  Airway Mallampati: II   Neck ROM: full    Dental   Pulmonary asthma , sleep apnea    breath sounds clear to auscultation       Cardiovascular hypertension, + dysrhythmias Atrial Fibrillation  Rhythm:irregular Rate:Normal     Neuro/Psych    GI/Hepatic   Endo/Other    Morbid obesity  Renal/GU      Musculoskeletal   Abdominal   Peds  Hematology   Anesthesia Other Findings   Reproductive/Obstetrics                             Anesthesia Physical Anesthesia Plan  ASA: 3  Anesthesia Plan: General   Post-op Pain Management:    Induction: Intravenous  PONV Risk Score and Plan: 2 and Ondansetron, Dexamethasone, Midazolam and Treatment may vary due to age or medical condition  Airway Management Planned: Oral ETT  Additional Equipment:   Intra-op Plan:   Post-operative Plan: Extubation in OR  Informed Consent: I have reviewed the patients History and Physical, chart, labs and discussed the procedure including the risks, benefits and alternatives for the proposed anesthesia with the patient or authorized representative who has indicated his/her understanding and acceptance.     Dental advisory given  Plan Discussed with: CRNA, Anesthesiologist and Surgeon  Anesthesia Plan Comments:        Anesthesia Quick Evaluation

## 2023-08-01 NOTE — Transfer of Care (Signed)
Immediate Anesthesia Transfer of Care Note  Patient: Jeffery Nielsen  Procedure(s) Performed: ATRIAL FIBRILLATION ABLATION  Patient Location: PACU and Cath Lab  Anesthesia Type:General  Level of Consciousness: drowsy  Airway & Oxygen Therapy: Patient Spontanous Breathing and Patient connected to nasal cannula oxygen  Post-op Assessment: Report given to RN and Post -op Vital signs reviewed and stable  Post vital signs: Reviewed and stable  Last Vitals:  Vitals Value Taken Time  BP 110/81 08/01/23 1335  Temp 36.4 C 08/01/23 1322  Pulse 69 08/01/23 1338  Resp 13 08/01/23 1338  SpO2 98 % 08/01/23 1338  Vitals shown include unfiled device data.  Last Pain:  Vitals:   08/01/23 1324  TempSrc:   PainSc: 0-No pain         Complications: There were no known notable events for this encounter.
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
## 2023-08-02 ENCOUNTER — Encounter (HOSPITAL_COMMUNITY): Payer: Self-pay | Admitting: Cardiovascular Disease

## 2023-08-02 ENCOUNTER — Other Ambulatory Visit: Payer: Self-pay | Admitting: Cardiology

## 2023-08-02 NOTE — Telephone Encounter (Signed)
Prescription refill request for Xarelto received.  Indication:afib Last office visit:4/24 Weight:121.6  kg Age:57 Scr:1.08  8/24 CrCl:128.23  ml/min  Prescription refilled

## 2023-08-02 NOTE — Anesthesia Postprocedure Evaluation (Signed)
Anesthesia Post Note  Patient: Jeffery Nielsen  Procedure(s) Performed: ATRIAL FIBRILLATION ABLATION     Patient location during evaluation: PACU Anesthesia Type: General Level of consciousness: awake and alert Pain management: pain level controlled Vital Signs Assessment: post-procedure vital signs reviewed and stable Respiratory status: spontaneous breathing, nonlabored ventilation, respiratory function stable and patient connected to nasal cannula oxygen Cardiovascular status: blood pressure returned to baseline and stable Postop Assessment: no apparent nausea or vomiting Anesthetic complications: no   There were no known notable events for this encounter.  Last Vitals:  Vitals:   08/01/23 1530 08/01/23 1600  BP: (!) 121/48 132/74  Pulse: 79 89  Resp: 20 16  Temp:    SpO2: 97% 98%    Last Pain:  Vitals:   08/01/23 1409  TempSrc:   PainSc: 0-No pain                 Keats Kingry S

## 2023-08-15 ENCOUNTER — Other Ambulatory Visit: Payer: Self-pay | Admitting: Family Medicine

## 2023-08-16 ENCOUNTER — Ambulatory Visit (HOSPITAL_COMMUNITY): Payer: 59 | Admitting: Internal Medicine

## 2023-08-24 LAB — AMB RESULTS CONSOLE CBG: Glucose: 108

## 2023-08-29 ENCOUNTER — Encounter (HOSPITAL_COMMUNITY): Payer: Self-pay | Admitting: Internal Medicine

## 2023-08-29 ENCOUNTER — Ambulatory Visit (HOSPITAL_COMMUNITY)
Admission: RE | Admit: 2023-08-29 | Discharge: 2023-08-29 | Disposition: A | Discharge status: Home / Self Care | Payer: 59 | Source: Ambulatory Visit | Attending: Internal Medicine | Admitting: Internal Medicine

## 2023-08-29 ENCOUNTER — Encounter (HOSPITAL_COMMUNITY): Payer: Self-pay

## 2023-08-29 VITALS — BP 134/84 | HR 83 | Ht 71.0 in | Wt 276.0 lb

## 2023-08-29 DIAGNOSIS — I480 Paroxysmal atrial fibrillation: Secondary | ICD-10-CM

## 2023-08-29 DIAGNOSIS — Z79899 Other long term (current) drug therapy: Secondary | ICD-10-CM | POA: Insufficient documentation

## 2023-08-29 DIAGNOSIS — Z6838 Body mass index (BMI) 38.0-38.9, adult: Secondary | ICD-10-CM | POA: Diagnosis not present

## 2023-08-29 DIAGNOSIS — E669 Obesity, unspecified: Secondary | ICD-10-CM | POA: Insufficient documentation

## 2023-08-29 DIAGNOSIS — I10 Essential (primary) hypertension: Secondary | ICD-10-CM | POA: Insufficient documentation

## 2023-08-29 DIAGNOSIS — G4733 Obstructive sleep apnea (adult) (pediatric): Secondary | ICD-10-CM | POA: Insufficient documentation

## 2023-08-29 DIAGNOSIS — Z7901 Long term (current) use of anticoagulants: Secondary | ICD-10-CM | POA: Insufficient documentation

## 2023-08-29 DIAGNOSIS — I4819 Other persistent atrial fibrillation: Secondary | ICD-10-CM | POA: Insufficient documentation
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
## 2023-09-04 NOTE — Progress Notes (Signed)
Patient attended a screening event 08/24/2023. Patient advised to follow up with PCP for blood pressure 166/94. Blood glucose WNL 108. Patient declined to screen for SDOH.

## 2023-09-05 ENCOUNTER — Emergency Department (HOSPITAL_COMMUNITY)
Admission: EM | Admit: 2023-09-05 | Discharge: 2023-09-06 | Disposition: A | Discharge status: Home / Self Care | Payer: 59 | Attending: Emergency Medicine | Admitting: Emergency Medicine

## 2023-09-05 ENCOUNTER — Emergency Department (HOSPITAL_COMMUNITY): Payer: 59

## 2023-09-05 ENCOUNTER — Telehealth: Payer: Self-pay | Admitting: Cardiology

## 2023-09-05 ENCOUNTER — Other Ambulatory Visit: Payer: Self-pay

## 2023-09-05 DIAGNOSIS — R911 Solitary pulmonary nodule: Secondary | ICD-10-CM | POA: Diagnosis not present

## 2023-09-05 DIAGNOSIS — Z7901 Long term (current) use of anticoagulants: Secondary | ICD-10-CM | POA: Diagnosis not present

## 2023-09-05 DIAGNOSIS — R0789 Other chest pain: Secondary | ICD-10-CM | POA: Insufficient documentation

## 2023-09-05 DIAGNOSIS — Z79899 Other long term (current) drug therapy: Secondary | ICD-10-CM | POA: Diagnosis not present

## 2023-09-05 DIAGNOSIS — I10 Essential (primary) hypertension: Secondary | ICD-10-CM | POA: Diagnosis not present

## 2023-09-05 LAB — CBC
HCT: 44.4 % (ref 39.0–52.0)
Hemoglobin: 14.7 g/dL (ref 13.0–17.0)
MCH: 30.3 pg (ref 26.0–34.0)
MCHC: 33.1 g/dL (ref 30.0–36.0)
MCV: 91.5 fL (ref 80.0–100.0)
Platelets: 248 10*3/uL (ref 150–400)
RBC: 4.85 MIL/uL (ref 4.22–5.81)
RDW: 13 % (ref 11.5–15.5)
WBC: 6.9 10*3/uL (ref 4.0–10.5)
nRBC: 0 % (ref 0.0–0.2)

## 2023-09-05 LAB — BASIC METABOLIC PANEL
Anion gap: 7 (ref 5–15)
BUN: 20 mg/dL (ref 6–20)
CO2: 23 mmol/L (ref 22–32)
Calcium: 8.5 mg/dL — ABNORMAL LOW (ref 8.9–10.3)
Chloride: 105 mmol/L (ref 98–111)
Creatinine, Ser: 1.18 mg/dL (ref 0.61–1.24)
GFR, Estimated: >60 mL/min (ref >60)
Glucose, Bld: 120 mg/dL — ABNORMAL HIGH (ref 70–99)
Potassium: 3.7 mmol/L (ref 3.5–5.1)
Sodium: 135 mmol/L (ref 135–145)

## 2023-09-05 LAB — TROPONIN I (HIGH SENSITIVITY): Troponin I (High Sensitivity): 3 ng/L (ref <18)
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
## 2023-09-06 ENCOUNTER — Telehealth: Payer: Self-pay | Admitting: Cardiology

## 2023-09-06 ENCOUNTER — Emergency Department (HOSPITAL_COMMUNITY): Payer: 59

## 2023-09-06 LAB — TROPONIN I (HIGH SENSITIVITY)
Troponin I (High Sensitivity): 3 ng/L (ref <18)
Troponin I (High Sensitivity): 3 ng/L (ref <18)

## 2023-09-06 MED ORDER — KETOROLAC TROMETHAMINE 30 MG/ML IJ SOLN
15.0000 mg | Freq: Once | INTRAMUSCULAR | Status: AC
  Administered 2023-09-06: 15 mg via INTRAVENOUS
  Filled 2023-09-06: qty 1

## 2023-09-06 MED ORDER — DOXYCYCLINE HYCLATE 100 MG PO CAPS: 100.0000 mg | ORAL_CAPSULE | Freq: Two times a day (BID) | ORAL | 0 refills | Status: DC

## 2023-09-06 MED ORDER — IOHEXOL 350 MG/ML SOLN
75.0000 mL | Freq: Once | INTRAVENOUS | Status: AC | PRN
  Administered 2023-09-06: 75 mL via INTRAVENOUS

## 2023-09-06 NOTE — Telephone Encounter (Signed)
Yes he should take the antibiotic

## 2023-09-06 NOTE — Telephone Encounter (Signed)
Informed pt of pharmacist recommendation that he should take prescribed antibiotic. Pt had concerns regarding his allergy list. Instructed pt to verify medication against allergy list with pharmacist when he goes to pick it up today. Pt verbalized understanding. No further questions or concerns at this time.

## 2023-09-06 NOTE — ED Provider Notes (Signed)
[place] EMERGENCY DEPARTMENT AT [facility] Provider Note   CSN: [id number] Arrival date & time: 09/05/23  2056     History  Chief Complaint  Patient presents with   Chest Pain    Jeffery Nielsen is a 57 y.o. male.  Patient with a history of hypertension, atrial fibrillation status post recent ablation presenting with episode of chest pain while driving today about 11 AM.  Pain is to his left chest wall and is worse with movement of his left arm and worse with leaning forward.  Nothing makes it better.  Did not take any for it at home.  No radiation of the pain to his arm, neck or back.  No associated shortness of breath, nausea, vomiting, cough or fever.  Missed his Xarelto dose last night but otherwise has been compliant.  States he has been on it for several months.  No known CAD or stents.  Reports history of atrial fibrillation and hypertension.   Pain is worse with movement of his left arm and movement of his chest wall.  Does not feel short of breath.  No diaphoresis or vomiting.  Pain is not exertional or pleuritic.  Recent CT coronary: Calcium score 28.9 with isolated area of calcium in proximal LAD This is 74 th percentile for age/sex     The history is provided by the patient.  Chest Pain Associated symptoms: no abdominal pain, no dizziness, no fever, no headache, no nausea, no shortness of breath, no vomiting and no weakness        Home Medications Prior to Admission medications   Medication Sig Start Date End Date Taking? Authorizing Provider  albuterol (VENTOLIN HFA) 108 (90 Base) MCG/ACT inhaler Inhale 2 puffs into the lungs every 6 (six) hours as needed for wheezing or shortness of breath. 12/28/22   Lockie Mola, MD  diltiazem (CARDIZEM CD) 240 MG 24 hr capsule Take 240 mg by mouth daily. 08/16/23   [provider]  diltiazem (CARDIZEM) 30 MG tablet Take 1 tablet every 4 hours AS NEEDED for AFIB heart rate >100 as long as top BP >100. 02/14/23    Mealor, Roberts Gaudy, MD  enalapril (VASOTEC) 20 MG tablet Take 1 tablet (20 mg total) by mouth 2 (two) times daily. PLEASE COME IN FOR AN OFFICE VISIT 08/15/23   [name], MD  fluticasone (FLONASE) 50 MCG/ACT nasal spray Place 1 spray into both nostrils daily as needed for allergies.    [provider]  hydrochlorothiazide (MICROZIDE) 12.5 MG capsule Take 2 capsules (25 mg total) by mouth daily. Patient taking differently: Take 12.5 mg by mouth 2 (two) times daily. 01/07/23   [name], MD  magnesium gluconate (MAGONATE) 500 MG tablet Take 500 mg by mouth daily.    [provider]  metoprolol succinate (TOPROL-XL) 25 MG 24 hr tablet Take 1 tablet (25 mg total) by mouth daily. 02/14/23   Mealor, Roberts Gaudy, MD  Misc Natural Products (BEET ROOT PO) Take 1 tablet by mouth daily.    [provider]  XARELTO 20 MG TABS tablet TAKE 1 TABLET BY MOUTH DAILY WITH SUPPER 08/02/23   Lewayne Bunting, MD      Allergies    Amoxil [amoxicillin] and Penicillins    Review of Systems   Review of Systems  Constitutional:  Negative for activity change, appetite change and fever.  HENT:  Negative for congestion and rhinorrhea.   Respiratory:  Positive for chest tightness. Negative for shortness  of breath.   Cardiovascular:  Positive for chest pain.  Gastrointestinal:  Negative for abdominal pain, nausea and vomiting.  Genitourinary:  Negative for dysuria and hematuria.  Musculoskeletal:  Negative for arthralgias and myalgias.  Skin:  Negative for rash.  Neurological:  Negative for dizziness, weakness and headaches.   all other systems are negative except as noted in the HPI and PMH.    Physical Exam Updated Vital Signs BP (!) 143/77 (BP Location: Right Arm)   Pulse 86   Temp 98.2 F (36.8 C) (Oral)   Resp 17   Ht 5\' 11"  (1.803 m)   Wt 124.7 kg   SpO2 99%   BMI 38.35 kg/m  Physical Exam Vitals and nursing note reviewed.  Constitutional:       General: He is not in acute distress.    Appearance: He is well-developed.  HENT:     Head: Normocephalic and atraumatic.     Mouth/Throat:     Pharynx: No oropharyngeal exudate.  Eyes:     Conjunctiva/sclera: Conjunctivae normal.     Pupils: Pupils are equal, round, and reactive to light.  Neck:     Comments: No meningismus. Cardiovascular:     Rate and Rhythm: Normal rate and regular rhythm.     Heart sounds: Normal heart sounds. No murmur heard. Pulmonary:     Effort: Pulmonary effort is normal. No respiratory distress.     Breath sounds: Normal breath sounds.  Chest:     Chest wall: Tenderness present.  Abdominal:     Palpations: Abdomen is soft.     Tenderness: There is no abdominal tenderness. There is no guarding or rebound.  Musculoskeletal:        General: No tenderness. Normal range of motion.     Cervical back: Normal range of motion and neck supple.  Skin:    General: Skin is warm.  Neurological:     Mental Status: He is alert and oriented to person, place, and time.     Cranial Nerves: No cranial nerve deficit.     Motor: No abnormal muscle tone.     Coordination: Coordination normal.     Comments:  5/5 strength throughout. CN 2-12 intact.Equal grip strength.   Psychiatric:        Behavior: Behavior normal.     ED Results / Procedures / Treatments   Labs (all labs ordered are listed, but only abnormal results are displayed) Labs Reviewed  BASIC METABOLIC PANEL - Abnormal; Notable for the following components:      Result Value   Glucose, Bld 120 (*)    Calcium 8.5 (*)    All other components within normal limits  CBC  TROPONIN I (HIGH SENSITIVITY)  TROPONIN I (HIGH SENSITIVITY)  TROPONIN I (HIGH SENSITIVITY)  TROPONIN I (HIGH SENSITIVITY)    EKG EKG Interpretation Date/Time:  Thursday September 05 2023 21:02:11 EDT Ventricular Rate:  81 PR Interval:  159 QRS Duration:  76 QT Interval:  369 QTC Calculation: 429 R Axis:   75  Text  Interpretation: Sinus rhythm No significant change was found Confirmed by Glynn Octave [phone]) on 09/06/2023 2:13:32 AM  Radiology CT Angio Chest PE W and/or Wo Contrast  Result Date: 09/06/2023 CLINICAL DATA:  Pulmonary embolism suspected, positive D-dimer EXAM: CT ANGIOGRAPHY CHEST WITH CONTRAST TECHNIQUE: Multidetector CT imaging of the chest was performed using the standard protocol during bolus administration of intravenous contrast. Multiplanar CT image reconstructions and MIPs were obtained to evaluate the vascular anatomy. RADIATION  DOSE REDUCTION: This exam was performed according to the departmental dose-optimization program which includes automated exposure control, adjustment of the mA and/or kV according to patient size and/or use of iterative reconstruction technique. CONTRAST:  75mL OMNIPAQUE IOHEXOL 350 MG/ML SOLN COMPARISON:  Cardiac CTA 07/12/2023 FINDINGS: Cardiovascular: Satisfactory opacification of the pulmonary arteries to the segmental level. No evidence of pulmonary embolism. Normal heart size. No pericardial effusion. Mediastinum/Nodes: New generous sized lymph nodes asymmetric to the left hilum, likely reactive given pulmonary findings. No mediastinal adenopathy Lungs/Pleura: Generalized airway thickening with mild cylindrical bronchiectasis in the lower lobes, especially on the left. There is a small nodular density with ill-defined margins in the medial left lower lobe measuring 7 mm average diameter on 10:221. Based on landmarks, this should be covered on prior study and was not visible. Mild mucoid impaction bilaterally. Upper Abdomen: 17 mm cystic density in the left lobe liver. Musculoskeletal: No acute or aggressive finding Review of the MIP images confirms the above findings. IMPRESSION: 1. Negative for pulmonary embolism. 2. Chronic bronchitis with mild cylindrical bronchiectasis. 3. There is a 7 mm nodule in the left lower lobe and asymmetric left hilar lymph node  enlargement, new from cardiac CT 07/12/2023 and considered infectious/inflammatory. Electronically Signed   By: Tiburcio Pea M.D.   On: 09/06/2023 07:02   DG Chest 2 View  Result Date: 09/05/2023 CLINICAL DATA:  Nonradiating left-sided chest pain. EXAM: CHEST - 2 VIEW COMPARISON:  06/27/2023 FINDINGS: Stable cardiomediastinal silhouette. Aortic atherosclerotic calcification. No focal consolidation, pleural effusion, or pneumothorax. No displaced rib fractures. IMPRESSION: No acute cardiopulmonary disease. Electronically Signed   By: Minerva Fester M.D.   On: 09/05/2023 23:07    Procedures Procedures    Medications Ordered in ED Medications  ketorolac (TORADOL) 30 MG/ML injection 15 mg (has no administration in time range)    ED Course/ Medical Decision Making/ A&P                                 Medical Decision Making Amount and/or Complexity of Data Reviewed Labs: ordered. Decision-making details documented in ED Course. Radiology: ordered and independent interpretation performed. Decision-making details documented in ED Course. ECG/medicine tests: ordered and independent interpretation performed. Decision-making details documented in ED Course.  Risk Prescription drug management.   Left-sided chest pain since 11 AM worse with movement.  EKG is sinus rhythm without acute ST changes.  Pain not reproducible but sore to palpation.  Troponin negative x 2.  Low suspicion for ACS or PE.  Chest x-ray negative for pneumothorax or pneumonia.  Results reviewed and interpreted by me.  Troponin negative x 2 which is encouraging the setting of constant chest pain since 11 AM.  Not consistent with ACS.  Pain is improved with Toradol.  Pain worse with movement of his arm and thorax.  He is concerned about possibility of pulmonary embolism as he did miss a dose of Xarelto last night.  CT scan is negative for pulmonary embolism but does show a lung nodule which patient is made aware  of.  Pain has resolved after Toradol. Follow-up with his cardiologist.  Discussed follow-up with his PCP regarding lung nodule need for repeat imaging in 3 months after course of antibiotics. Return to the ED with exertional chest pain, pain associate with shortness of breath, nausea, vomiting, sweating or other concerns.        Final Clinical Impression(s) / ED Diagnoses Final diagnoses:  Atypical chest pain  Lung nodule    Rx / DC Orders ED Discharge Orders     None         Lewi Drost, Jeannett Senior, MD 09/06/23 [phone]

## 2023-09-06 NOTE — Telephone Encounter (Signed)
Pt c/o medication issue:  1. Name of Medication:   doxycycline (VIBRAMYCIN) 100 MG capsule   2. How are you currently taking this medication (dosage and times per day)?   Not taking yet  3. Are you having a reaction (difficulty breathing--STAT)?   4. What is your medication issue?   Patient stated he went to ED and they prescribed him this medication for possible lung infection.  Patient wants to know if he should take this medication.

## 2023-09-06 NOTE — Discharge Instructions (Addendum)
There is no evidence of heart attack or blood clot in the lungs.  Medications as prescribed.  Your CT scan did show a possible nodule to your left lung which may be something infectious.  Take the antibiotics as prescribed and follow-up with your doctor for repeat CT scan in 3 months to ensure this is not changing. Return to the ED with exertional chest pain, pain associate with shortness of breath, nausea, vomiting, sweating or other concerns.

## 2023-09-14 ENCOUNTER — Other Ambulatory Visit: Payer: Self-pay | Admitting: Family Medicine

## 2023-09-18 ENCOUNTER — Ambulatory Visit: Payer: 59 | Admitting: Family Medicine

## 2023-09-18 ENCOUNTER — Other Ambulatory Visit: Payer: Self-pay

## 2023-09-18 VITALS — BP 136/77 | HR 84 | Ht 71.0 in | Wt 275.4 lb

## 2023-09-18 DIAGNOSIS — Z23 Encounter for immunization: Secondary | ICD-10-CM | POA: Diagnosis not present

## 2023-09-18 DIAGNOSIS — E66813 Obesity, class 3: Secondary | ICD-10-CM

## 2023-09-18 DIAGNOSIS — Z125 Encounter for screening for malignant neoplasm of prostate: Secondary | ICD-10-CM | POA: Diagnosis not present

## 2023-09-18 DIAGNOSIS — I10 Essential (primary) hypertension: Secondary | ICD-10-CM | POA: Diagnosis not present

## 2023-09-18 DIAGNOSIS — Z13220 Encounter for screening for lipoid disorders: Secondary | ICD-10-CM

## 2023-09-18 NOTE — Assessment & Plan Note (Signed)
Unchanged.  He wants to work on diet but would consider GLP1 as several of his family are taking.

## 2023-09-18 NOTE — Assessment & Plan Note (Signed)
At goal continue current medications  

## 2023-09-18 NOTE — Progress Notes (Signed)
    SUBJECTIVE:   CHIEF COMPLAINT / HPI:   Here for physical.  Overall feels well  Hypertension Did not bring his medications but knows them and takes regularly  Afib Sees cardiology regularly.  Has follow up after recent ED visit for chest pain.  Not having any pain now  Weight He had lost down to 250 but has regained.  Wants to work on diet.  Is aware of GLP1s    OBJECTIVE:   BP 136/77   Pulse 84   Ht 5\' 11"  (1.803 m)   Wt 275 lb 6.4 oz (124.9 kg)   SpO2 99%   BMI 38.41 kg/m   Heart - Regular rate and rhythm.  No murmurs, gallops or rubs.    Lungs:  Normal respiratory effort, chest expands symmetrically. Lungs are clear to auscultation, no crackles or wheezes. Extremities:  No cyanosis, edema, or deformity noted with good range of motion of all major joints.   Abdomen: soft and non-tender without masses, organomegaly or hernias noted.  No guarding or rebound Has well circumscribed approx 0.8 cm dark nevus with hair on his L hand   ASSESSMENT/PLAN:   Screening for malignant neoplasm of prostate -     PSA  Screening cholesterol level -     Lipid panel  Encounter for immunization -     Flu vaccine trivalent PF, 6mos and older(Flulaval,Afluria,Fluarix,Fluzone)  Primary hypertension Assessment & Plan: At goal continue current medications    Class 3 severe obesity due to excess calories without serious comorbidity in adult, unspecified BMI (HCC) Assessment & Plan: Unchanged.  He wants to work on diet but would consider GLP1 as several of his family are taking.       Annual Examination Male over 36 yo  I reviewed the following patient responses on our Physical Exam Form Tobacco use and candidacy for lung cancer or aneurysm screening Alcohol Use  Weight  Exercise  Risk for STI  Fall risk Advanced Directive Increased family cancer risk Violence risk  PHQ9 score reviewed  Blood pressure reviewed  I considered the following items based upon USPSTF  recommendations: Colon cancer screening UTD Prostate Cancer if male at birth - ordered  HIV testing:  Hepatitis C testing Cholesterol screening - ordered  STI screening if high risk (Hepatitis B, Syphilis, Gonorrhea, Chlamydia) Immunizations - Influenza, Covid, Shingle, Pneumonia, Tetanus  See After Visit Summary for recommendations  Patient Instructions  Good to see you today - Thank you for coming in  Things we discussed today:  I sent a prescription to your pharmacy for your Zoster vaccines to help prevent Shingles.  The shot may cause a sore arm and mild flu like symptoms for a few days.  You will need a second shot 2 months after your first.    I will call you if your tests are not good.  Otherwise, I will send you a message on MyChart (if it is active) or a letter in the mail..  If you do not hear from me with in 2 weeks please call our office.     Weight Work on portion lower calorie food If want to consider the GLP1s - come back to discuss  Please always bring your medication bottles     [name], MD [facility]

## 2023-09-18 NOTE — Patient Instructions (Signed)
Good to see you today - Thank you for coming in  Things we discussed today:  I sent a prescription to your pharmacy for your Zoster vaccines to help prevent Shingles.  The shot may cause a sore arm and mild flu like symptoms for a few days.  You will need a second shot 2 months after your first.    I will call you if your tests are not good.  Otherwise, I will send you a message on MyChart (if it is active) or a letter in the mail..  If you do not hear from me with in 2 weeks please call our office.     Weight Work on portion lower calorie food If want to consider the GLP1s - come back to discuss  Please always bring your medication bottles

## 2023-09-19 LAB — LIPID PANEL
Chol/HDL Ratio: 2.9 ratio (ref 0.0–5.0)
Cholesterol, Total: 134 mg/dL (ref 100–199)
HDL: 46 mg/dL (ref >39)
LDL Chol Calc (NIH): 75 mg/dL (ref 0–99)
Triglycerides: 61 mg/dL (ref 0–149)
VLDL Cholesterol Cal: 13 mg/dL (ref 5–40)

## 2023-09-19 LAB — PSA: Prostate Specific Ag, Serum: 0.5 ng/mL (ref 0.0–4.0)

## 2023-09-29 NOTE — Progress Notes (Unsigned)
Cardiology Office Note:  .   Date:  10/01/2023  ID:  Jeffery Nielsen, DOB 07/06/1966, MRN [id number] PCP: [name], MD  [facility] Providers Cardiologist:  Olga Millers, MD Electrophysiologist:  Maurice Small, MD }   }   History of Present Illness: .   Jeffery Nielsen is a 57 y.o. male with h/o HTN, atrial fibrillation (status post A-fib ablation 05/10/2020 after failing Multaq, and a second A-fib ablation on 08/01/2023 by Dr. [name]), hypertension and OSA on CPAP,followed by the A-fib clinic and was seen recently on 08/29/2023 for initial establishment with the providers.    He had recurrence of atrial fibrillation and was seen in the ED but spontaneously converted to sinus rhythm.  He was continued on Xarelto with a CHA2DS2-VASc score 1.  He has had a history of forgetting to take Xarelto.  He was advised to start taking it at nighttime more easier for him to remember.  He was seen in the ED for recurrent chest pain and was ruled out for ACS or PE.  Mr. Okimoto comes today without any cardiac complaints.  He believes his chest discomfort was related to musculoskeletal as he had been lifting heavy boxes at work and that has resolved now on its own.  He denies any rapid heart rhythm, palpitations.  He did fall from his truck and landed on his left side injuring his wrist.  He did ice his wrist and it is gotten some better but he is getting follow-up with his PCP.  He missed stepped and did not realize that he did not have any support when he was climbing down from his truck.  He is more medically compliant on Xarelto now has it on a regular schedule and he has not missed any doses.  ROS: As above otherwise negative.  Studies Reviewed: Marland Kitchen    Coronary CTA 07/12/2023 IMPRESSION: 1.  Mild bi atrial enlargement   2.  Windsock atrial appendage with no thrombus   3.  No ASD/PFO   4.  No pericardial effusion   5.  Normal ascending thoracic aorta 3.1 cm   6. Calcium score 28.9 with  isolated area of calcium in proximal LAD This is 74 th percentile for age/sex   7.  Normal PV anatomy see measurements above   EKG Interpretation Date/Time:  Tuesday October 01 2023 08:08:09 EST Ventricular Rate:  76 PR Interval:  120 QRS Duration:  80 QT Interval:  368 QTC Calculation: 414 R Axis:   15  Text Interpretation: Unusual P axis, possible ectopic atrial rhythm When compared with ECG of 05-Sep-2023 21:02, PREVIOUS ECG IS PRESENT Confirmed by Joni Reining [phone]) on 10/01/2023 8:11:45 AM    Physical Exam:   VS:  BP 132/88   Pulse 76   Ht 5\' 11"  (1.803 m)   Wt 274 lb 6.4 oz (124.5 kg)   SpO2 98%   BMI 38.27 kg/m    Wt Readings from Last 3 Encounters:  10/01/23 274 lb 6.4 oz (124.5 kg)  09/18/23 275 lb 6.4 oz (124.9 kg)  09/05/23 275 lb (124.7 kg)    GEN: Well nourished, well developed in no acute distress NECK: No JVD; No carotid bruits CARDIAC: RRR, no murmurs, rubs, gallops RESPIRATORY:  Clear to auscultation without rales, wheezing or rhonchi  ABDOMEN: Soft, non-tender, non-distended EXTREMITIES:  No edema; No deformity some discomfort in the left wrist.  ASSESSMENT AND PLAN: .    Paroxysmal atrial fibrillation: No recurrence of irregular heart rhythm  since ED evaluation.  He is in normal sinus rhythm today.  He is noncompliant with Xarelto is now on a evening dose regimen.  He has no further complaints of irregular heart rhythm or chest discomfort.  Continue diltiazem 240 mg daily, metoprolol 25 mg daily, and Xarelto 20 mg daily.  He will follow-up with Dr. Vassie Moment on previously scheduled appointment on December 4.  2.  Hypertension: Blood pressure is well-controlled on current medication regimen.  Remains on enalapril and HCTZ along with the diltiazem.  No changes.  Labs reviewed from ED, creatinine 1.18.  Potassium 3.7.  3.  OSA: Remains on CPAP.  Reports compliance.         Signed, Bettey Mare. Liborio Nixon, ANP, AACC

## 2023-10-01 ENCOUNTER — Ambulatory Visit: Payer: 59 | Attending: Adult Health | Admitting: Adult Health

## 2023-10-01 ENCOUNTER — Encounter: Payer: Self-pay | Admitting: Adult Health

## 2023-10-01 VITALS — BP 132/88 | HR 76 | Ht 71.0 in | Wt 274.4 lb

## 2023-10-01 DIAGNOSIS — I10 Essential (primary) hypertension: Secondary | ICD-10-CM | POA: Diagnosis not present

## 2023-10-01 NOTE — Patient Instructions (Signed)
Medication Instructions:  No Changes *If you need a refill on your cardiac medications before your next appointment, please call your pharmacy*   Lab Work: No Labs If you have labs (blood work) drawn today and your tests are completely normal, you will receive your results only by: MyChart Message (if you have MyChart) OR A paper copy in the mail If you have any lab test that is abnormal or we need to change your treatment, we will call you to review the results.   Testing/Procedures: No Testing   Follow-Up: At [facility], you and your health needs are our priority.  As part of our continuing mission to provide you with exceptional heart care, we have created designated Provider Care Teams.  These Care Teams include your primary Cardiologist (physician) and Advanced Practice Providers (APPs -  Physician Assistants and Nurse Practitioners) who all work together to provide you with the care you need, when you need it.  We recommend signing up for the patient portal called "MyChart".  Sign up information is provided on this After Visit Summary.  MyChart is used to connect with patients for Virtual Visits (Telemedicine).  Patients are able to view lab/test results, encounter notes, upcoming appointments, etc.  Non-urgent messages can be sent to your provider as well.   To learn more about what you can do with MyChart, go to [email or web address].    Your next appointment:   6 month(s)  Provider:   Brian Crenshaw, MD   

## 2023-10-13 ENCOUNTER — Other Ambulatory Visit: Payer: Self-pay | Admitting: Family Medicine

## 2023-10-21 ENCOUNTER — Ambulatory Visit: Payer: 59 | Admitting: Cardiovascular Disease

## 2023-10-30 ENCOUNTER — Ambulatory Visit: Payer: 59 | Attending: Cardiovascular Disease | Admitting: Cardiovascular Disease

## 2023-10-30 ENCOUNTER — Encounter: Payer: Self-pay | Admitting: Cardiovascular Disease

## 2023-10-30 VITALS — BP 140/82 | HR 83 | Ht 71.0 in | Wt 280.8 lb

## 2023-10-30 DIAGNOSIS — I4819 Other persistent atrial fibrillation: Secondary | ICD-10-CM

## 2023-10-30 NOTE — Progress Notes (Signed)
Electrophysiology Office Note:    Date:  10/30/2023   ID:  Jeffery Nielsen, DOB 06/02/1966, MRN [id number]  PCP:  [name], MD   [facility] Providers Cardiologist:  Olga Millers, MD Electrophysiologist:  Maurice Small, MD     Referring MD: [name], *   History of Present Illness:    Jeffery Nielsen is a 57 y.o. male with a hx listed below, significant for atrial fibrillation, OSA, HTN referred for arrhythmia management.  Pt underwent AF ablation by Dr. [name] on 05/10/2020 after failing multaq. He had recurrenc of AF in 09/04/22. This occurred in the setting of URI and poor compliance with CPAP use for that reason, and he converted back to sinus rhythm spontaneously.  He has episodes that he is fairly certain atrial fibrillation.  He also has a monitoring device that has detected tachycardia.  He is going to send [place] these tracings via MyChart.  He feels that his heart rates are not as fast as they have been in the past, but he can still sense that he is having atrial fibrillation.  He underwent repeat EP study and ablation for atrial fibrillation on August 01, 2023.  Electroanatomic mapping revealed normal voltage on the posterior wall and largely intact pulmonary vein isolation lines though there were some breakthrough in the posterior left carina.  These lines were fairly tight to the pulmonary veins, so additional ablation was performed in the right and left pulmonary veins to enlarge the ablation within the antrum.  The posterior wall was also ablated.  Since the ablation, he had an ER visit for chest pain which ended up being pleuritic -- likely costochondritis after moving boxes. Otherwise, no evidence of AF recurrence.  EKGs/Labs/Other Studies Reviewed Today:    Echocardiogram:  TTE 09/04/2022  1. Left ventricular ejection fraction, by estimation, is 60 to 65%. The  left ventricle has normal function. The left ventricle has no regional  wall  motion abnormalities. Left ventricular diastolic parameters are  indeterminate.   2. Right ventricular systolic function is normal. The right ventricular  size is normal.   3. The mitral valve is normal in structure. No evidence of mitral valve  regurgitation. No evidence of mitral stenosis.   4. The aortic valve is tricuspid. Aortic valve regurgitation is not  visualized. No aortic stenosis is present.   5. The inferior vena cava is normal in size with greater than 50%  respiratory variability, suggesting right atrial pressure of 3 mmHg.   Monitors:    Advanced imaging:   EKG:  Last EKG results: today sinus rhythm 81 bpm   Recent Labs: 09/05/2023: BUN 20; Creatinine, Ser 1.18; Hemoglobin 14.7; Platelets 248; Potassium 3.7; Sodium 135     Physical Exam:    VS:  BP (!) 140/82   Pulse 83   Ht 5\' 11"  (1.803 m)   Wt 280 lb 12.8 oz (127.4 kg)   SpO2 97%   BMI 39.16 kg/m     Wt Readings from Last 3 Encounters:  10/30/23 280 lb 12.8 oz (127.4 kg)  10/01/23 274 lb 6.4 oz (124.5 kg)  09/18/23 275 lb 6.4 oz (124.9 kg)     GEN: Well nourished, well developed in no acute distress CARDIAC: RRR, no murmurs, rubs, gallops RESPIRATORY:  Normal work of breathing MUSCULOSKELETAL: no edema    ASSESSMENT & PLAN:    Atrial fibrillation - s/p ablation 04/2020, 08/01/2023 (PFA) - sustained recurrence 08/2022 (prior to PFA ablation) - no recurrence  of AF since  Secondary hypercoagulable state - continue xarelto 20  Obesity - weight loss is very important for maintenance of sinus rhythm  Obstructive sleep apnea - continue CPAP use      Follow-up 6 months  Medication Adjustments/Labs and Tests Ordered: Current medicines are reviewed at length with the patient today.  Concerns regarding medicines are outlined above.  Orders Placed This Encounter  Procedures   EKG 12-Lead   No orders of the defined types were placed in this encounter.    Signed, Maurice Small, MD   10/30/2023 9:43 AM    [facility]

## 2023-10-30 NOTE — Patient Instructions (Signed)
Medication Instructions:  Your physician recommends that you continue on your current medications as directed. Please refer to the Current Medication list given to you today. *If you need a refill on your cardiac medications before your next appointment, please call your pharmacy*   Follow-Up: At [facility], you and your health needs are our priority.  As part of our continuing mission to provide you with exceptional heart care, we have created designated Provider Care Teams.  These Care Teams include your primary Cardiologist (physician) and Advanced Practice Providers (APPs -  Physician Assistants and Nurse Practitioners) who all work together to provide you with the care you need, when you need it.  We recommend signing up for the patient portal called "MyChart".  Sign up information is provided on this After Visit Summary.  MyChart is used to connect with patients for Virtual Visits (Telemedicine).  Patients are able to view lab/test results, encounter notes, upcoming appointments, etc.  Non-urgent messages can be sent to your provider as well.   To learn more about what you can do with MyChart, go to [email or web address].    Your next appointment:   6 month(s)  Provider:   You will see one of the following Advanced Practice Providers on your designated Care Team:   Francis Dowse, Charlott Holler [address] [employer], [place] Sherie Don, NP Canary Brim, NP

## 2023-11-04 ENCOUNTER — Ambulatory Visit: Payer: 59 | Admitting: Cardiovascular Disease

## 2023-11-10 ENCOUNTER — Other Ambulatory Visit: Payer: Self-pay | Admitting: Family Medicine

## 2023-11-10 DIAGNOSIS — I10 Essential (primary) hypertension: Secondary | ICD-10-CM

## 2023-11-13 ENCOUNTER — Ambulatory Visit: Payer: 59 | Admitting: Family Medicine

## 2023-11-13 VITALS — BP 140/76 | HR 85 | Ht 71.0 in | Wt 282.8 lb

## 2023-11-13 DIAGNOSIS — M25532 Pain in left wrist: Secondary | ICD-10-CM | POA: Insufficient documentation

## 2023-11-13 DIAGNOSIS — E66813 Obesity, class 3: Secondary | ICD-10-CM

## 2023-11-13 MED ORDER — WEGOVY 0.25 MG/0.5ML ~~LOC~~ SOAJ
0.2500 mg | SUBCUTANEOUS | 0 refills | Status: DC
Start: 2023-11-13 — End: 2023-12-09

## 2023-11-13 NOTE — Progress Notes (Signed)
    SUBJECTIVE:   CHIEF COMPLAINT / HPI:   L wrist  Larey Seat on it around Oct 31.  Continues to ache on and off.  Has FROM.  Taking ibuprofen intermittently.  Just wanted to get it checked.  Had reconstructive surgery on that wrist in the 80s has been mildly achy and mild decreased range of motion since.  No workmans comp involved  Weight Has not been exercising or dieting as he thinks he should.  Is interested in try [facility].  No history of pancreatitis or family history of endocrine cancer    OBJECTIVE:   There were no vitals taken for this visit.  L Wrist - well healed dorsal scar.  Near FROM in flexion extension sup/pronation.  Distal stength sensation to LT intact.  Normal radial pulse.  Mild diffuse soft tissue swelling Mild tenderness with range of motion   ASSESSMENT/PLAN:   There are no diagnoses linked to this encounter.   There are no Patient Instructions on file for this visit.   [name], MD [facility]

## 2023-11-13 NOTE — Assessment & Plan Note (Addendum)
Not well controlled Discussed wegovy.  Will prescribe.

## 2023-11-13 NOTE — Patient Instructions (Signed)
Good to see you today - Thank you for coming in  Things we discussed today:  Wrist If not slowly improving then let us know and we can get an xray  Weight Will try Wegovy - a GLP1 - read about these Keep working on diet and exercise  I have enjoyed working with you - Be Well

## 2023-11-13 NOTE — Assessment & Plan Note (Signed)
Consistent with a strain.  Very unlikely fracture given exam.  If persists could get an xray.  Other wise symptom management

## 2023-11-14 ENCOUNTER — Telehealth: Payer: Self-pay

## 2023-11-14 NOTE — Telephone Encounter (Signed)
Pharmacy Patient Advocate Encounter   Received notification from CoverMyMeds that prior authorization for [facility] is required/requested.   The patient is insured through [facility] .   PA required; PA submitted to above mentioned insurance via CoverMyMeds Key/confirmation #/EOC [id number]. Status is pending

## 2023-11-15 NOTE — Telephone Encounter (Signed)
Pharmacy Patient Advocate Encounter  Received notification from [facility] that Prior Authorization for [facility] has been APPROVED from 11/14/23 to 03/14/24

## 2023-11-29 NOTE — Progress Notes (Signed)
 The patient attended 08/24/23 screening event where his blood glucose screening results were wnl. His BP screening results was 166/94. At the event the patient noted he has a pcp and insurance. Patient declined SDOH questionnaire. Per chart review pt does have a pcp team last ov with pcp was 11/13/23. According to chart review pcp's are aware of the pt's BP and hypertension is already listed in the chart. The pt has been seen by a pcp since our event. Chart review did not indicate any future appts. No additional Health equity team support indicated at this time.

## 2023-12-08 ENCOUNTER — Other Ambulatory Visit: Payer: Self-pay | Admitting: Family Medicine

## 2023-12-08 DIAGNOSIS — E66813 Obesity, class 3: Secondary | ICD-10-CM

## 2023-12-13 ENCOUNTER — Other Ambulatory Visit: Payer: Self-pay | Admitting: Cardiovascular Disease

## 2024-02-02 ENCOUNTER — Other Ambulatory Visit: Payer: Self-pay | Admitting: Cardiology

## 2024-02-03 NOTE — Telephone Encounter (Signed)
 Prescription refill request for Xarelto received.  Indication:afib Last office visit:12/24 Weight:128.3  kg Age:58 Scr:1.18  10/24 CrCl:122.32  ml/min  Prescription refilled

## 2024-02-18 ENCOUNTER — Other Ambulatory Visit: Payer: Self-pay | Admitting: Cardiovascular Disease

## 2024-03-01 ENCOUNTER — Other Ambulatory Visit: Payer: Self-pay | Admitting: Family Medicine

## 2024-03-01 DIAGNOSIS — E66813 Obesity, class 3: Secondary | ICD-10-CM

## 2024-03-24 ENCOUNTER — Telehealth: Payer: Self-pay

## 2024-03-24 ENCOUNTER — Ambulatory Visit: Admitting: Family Medicine

## 2024-03-24 ENCOUNTER — Encounter: Payer: Self-pay | Admitting: Family Medicine

## 2024-03-24 VITALS — BP 134/71 | HR 77 | Ht 71.0 in | Wt 279.0 lb

## 2024-03-24 DIAGNOSIS — E66813 Obesity, class 3: Secondary | ICD-10-CM | POA: Diagnosis not present

## 2024-03-24 LAB — POCT GLYCOSYLATED HEMOGLOBIN (HGB A1C): Hemoglobin A1C: 5.6 % (ref 4.0–5.6)

## 2024-03-24 MED ORDER — TIRZEPATIDE-WEIGHT MANAGEMENT 5 MG/0.5ML ~~LOC~~ SOLN: 5.0000 mg | SUBCUTANEOUS | 2 refills | Status: DC

## 2024-03-24 NOTE — Patient Instructions (Addendum)
 It was wonderful to see you today! Thank you for choosing [facility].   Please bring ALL of your medications with you to every visit.   Today we talked about:  I sent in the prescription for Zepbound, we will see if insurance approves it.  Similar to the other medication you will start in 5 mg weekly and we can titrated up from there as you may need a higher dose to see effect.  Please keep in mind the first week you might have some nausea and stomach upset.  This is because the medication slows down your digestion and you will accommodate by eating smaller meals.  As we discussed please focus on good dietary choices such as higher protein and fruit and vegetables.  Please follow up with PCP for yearly physical   Call the clinic at [phone] if your symptoms worsen or you have any concerns.  Please be sure to schedule follow up at the front desk before you leave today.   Jonne Netters, DO Family Medicine

## 2024-03-24 NOTE — Assessment & Plan Note (Signed)
 Has tried multiple different diet and exercise regimens for at least 6 months without weight loss.  Previously on Wegovy  with 20 pound weight loss but is now back up to 279.  Comorbidities include OSA, A-fib, HTN.  A1c normal.  Good candidate for GLP-1 therapy, will initiate Zepbound. -Start Zepbound 5 mg weekly

## 2024-03-24 NOTE — Telephone Encounter (Signed)
 Pharmacy Patient Advocate Encounter   Received notification from CoverMyMeds that prior authorization for ZEPBOUND 5MG  is required/requested.   Insurance verification completed.   The patient is insured through [facility] .   PA required; PA submitted to above mentioned insurance via CoverMyMeds Key/confirmation #/EOC [id number]. Status is pending

## 2024-03-24 NOTE — Progress Notes (Signed)
    SUBJECTIVE:   CHIEF COMPLAINT / HPI:   Weight loss Started Ozempic in 10/2023? Stopped Wegovy  due to insurance issues about 2 months ago. Got down to 260 on the medication but not back up to 279 off of it. Has trialed multiple diet and exercise regimens without success. Going to the gym x 3 times per day and plays basketball 30-40 mins per day.   PERTINENT  PMH / PSH: A-fib, asthma, OSA, HTN  OBJECTIVE:   BP 134/71   Pulse 77   Ht 5\' 11"  (1.803 m)   Wt 279 lb (126.6 kg)   SpO2 99%   BMI 38.91 kg/m    General: NAD, pleasant, able to participate in exam Cardiac: RRR, no murmurs. Respiratory: CTAB, normal effort, No wheezes, rales or rhonchi Extremities: no edema or cyanosis. Skin: warm and dry, no rashes noted Neuro: alert, no obvious focal deficits Psych: Normal affect and mood  ASSESSMENT/PLAN:   Assessment & Plan Class 3 severe obesity due to excess calories without serious comorbidity in adult, unspecified BMI (HCC) Has tried multiple different diet and exercise regimens for at least 6 months without weight loss.  Previously on Wegovy  with 20 pound weight loss but is now back up to 279.  Comorbidities include OSA, A-fib, HTN.  A1c normal.  Good candidate for GLP-1 therapy, will initiate Zepbound. -Start Zepbound 5 mg weekly   Dr. Jonne Netters, DO [facility]

## 2024-03-25 NOTE — Telephone Encounter (Signed)
 Pharmacy Patient Advocate Encounter  Received notification from [facility] that Prior Authorization for ZEPBOUND has been APPROVED from 03/24/24 to 09/23/24   PA #/Case ID/Reference #: [id number]

## 2024-05-19 ENCOUNTER — Other Ambulatory Visit: Payer: Self-pay | Admitting: Family Medicine

## 2024-05-19 DIAGNOSIS — I10 Essential (primary) hypertension: Secondary | ICD-10-CM

## 2024-05-25 NOTE — Telephone Encounter (Signed)
 Patient returns call to nurse line regarding Zepbound  authorization status.   Advised that this was approved as of 03/24/24. He is going to contact pharmacy to process prescription.   He will call back if there are any other issues.   [name], RN

## 2024-06-27 LAB — AMB RESULTS CONSOLE CBG: Glucose: 95

## 2024-06-27 NOTE — Progress Notes (Unsigned)
 Pt was seen at screening event on 06/27/2024. Pt had a BP of 158/88 and a blood sugar of 95. Pt documented that they do have a PCP but did not document having insurance. Pt did not document if they're a smoker and pt declined SDOH questionnaire .

## 2024-07-28 ENCOUNTER — Other Ambulatory Visit: Payer: Self-pay | Admitting: Cardiology

## 2024-07-28 NOTE — Telephone Encounter (Signed)
 Prescription refill request for Xarelto  received.  Indication:afib Last office visit:12/24 Weight:126.6  kg Age:58 Scr:1.18  10/24 CrCl:120.7  ml/min  Prescription refilled

## 2024-08-24 NOTE — Progress Notes (Signed)
    Pt was seen at screening event on 06/27/2024. Pt had a BP of 158/88 and a blood sugar of 95. Pt documented that they do have a PCP but did not document having insurance. Pt did not document if they're a smoker and pt declined SDOH questionnaire .  Chart review indicates that pt has Pruett,Kellner MD listed as his PCP and [place] [facility] listed as his insurance. Pt has an up and coming Cardiology appt on 10/29/24. CHW made first attempt call to follow up with pt on 08/24/24 at 1120 to inquire if pt has followed up with his pcp about his bp. Voicemail was left for pt asking him to call CHW back at his earliest convenience.  Pt called CHW back on 08/25/24 at 1336. Pt stated that he is working with cardiologist on his blood pressure and he has no other needs at this time.  Per health equity protocol no follow up to be scheduled.
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
## 2024-10-23 ENCOUNTER — Other Ambulatory Visit: Payer: Self-pay | Admitting: Family Medicine

## 2024-10-29 ENCOUNTER — Encounter: Payer: Self-pay | Admitting: Cardiology

## 2024-10-29 ENCOUNTER — Ambulatory Visit: Attending: Cardiology | Admitting: Cardiology

## 2024-10-29 VITALS — BP 138/72 | HR 92 | Ht 71.0 in | Wt 258.0 lb

## 2024-10-29 DIAGNOSIS — I4819 Other persistent atrial fibrillation: Secondary | ICD-10-CM

## 2024-10-29 DIAGNOSIS — E785 Hyperlipidemia, unspecified: Secondary | ICD-10-CM

## 2024-10-29 DIAGNOSIS — I2510 Atherosclerotic heart disease of native coronary artery without angina pectoris: Secondary | ICD-10-CM | POA: Diagnosis not present

## 2024-10-29 DIAGNOSIS — I10 Essential (primary) hypertension: Secondary | ICD-10-CM

## 2024-10-29 MED ORDER — ROSUVASTATIN CALCIUM 20 MG PO TABS: 20.0000 mg | ORAL_TABLET | Freq: Every day | ORAL | 3 refills | Status: AC

## 2024-10-29 NOTE — Patient Instructions (Signed)
 Medication Instructions:   START ROSUVASTATIN  20 MG ONCE DAILY  *If you need a refill on your cardiac medications before your next appointment, please call your pharmacy*  Lab Work:  Your physician recommends that you return for lab work in: [facility]  If you have labs (blood work) drawn today and your tests are completely normal, you will receive your results only by: MyChart Message (if you have MyChart) OR A paper copy in the mail If you have any lab test that is abnormal or we need to change your treatment, we will call you to review the results.  Follow-Up: At [facility], you and your health needs are our priority.  As part of our continuing mission to provide you with exceptional heart care, our providers are all part of one team.  This team includes your primary Cardiologist (physician) and Advanced Practice Providers or APPs (Physician Assistants and Nurse Practitioners) who all work together to provide you with the care you need, when you need it.  Your next appointment:   12 month(s)  Provider:   Redell Shallow, MD

## 2024-11-16 ENCOUNTER — Other Ambulatory Visit: Payer: Self-pay | Admitting: Cardiology

## 2024-11-16 ENCOUNTER — Other Ambulatory Visit: Payer: Self-pay | Admitting: Family Medicine

## 2024-11-16 DIAGNOSIS — E66813 Obesity, class 3: Secondary | ICD-10-CM

## 2024-11-17 ENCOUNTER — Other Ambulatory Visit: Payer: Self-pay

## 2024-11-17 DIAGNOSIS — I10 Essential (primary) hypertension: Secondary | ICD-10-CM

## 2024-11-17 MED ORDER — HYDROCHLOROTHIAZIDE 12.5 MG PO CAPS: 12.5000 mg | ORAL_CAPSULE | Freq: Every day | ORAL | 5 refills | Status: AC

## 2024-12-14 ENCOUNTER — Other Ambulatory Visit: Payer: Self-pay

## 2024-12-14 MED ORDER — METOPROLOL SUCCINATE ER 25 MG PO TB24: 25.0000 mg | ORAL_TABLET | Freq: Every day | ORAL | 3 refills | Status: AC

## 2024-12-18 ENCOUNTER — Other Ambulatory Visit (HOSPITAL_COMMUNITY): Payer: Self-pay

## 2024-12-18 ENCOUNTER — Telehealth: Payer: Self-pay

## 2024-12-18 NOTE — Telephone Encounter (Signed)
 Pharmacy Patient Advocate Encounter   Received notification from [facility] KEY that prior authorization for ZEPBOUND  5MG  is required/requested.   Insurance verification completed.   The patient is insured through [facility].   Per test claim: PA required; PA started via CoverMyMeds. KEY [id number] . Waiting for clinical questions to populate.

## 2024-12-21 NOTE — Telephone Encounter (Signed)
 Patient will need an appt for updated documentation.

## 2024-12-28 NOTE — Telephone Encounter (Signed)
 Archived key [id number]

## 2025-02-10 ENCOUNTER — Ambulatory Visit: Admitting: Cardiovascular Disease
